# Patient Record
Sex: Male | Born: 1935 | Race: Black or African American | Hispanic: No | Marital: Married | Smoking: Former smoker
Health system: Southern US, Community
[De-identification: ages and names within clinical notes are randomized; demographics above are authoritative.]

## PROBLEM LIST (undated history)

## (undated) DIAGNOSIS — I2510 Atherosclerotic heart disease of native coronary artery without angina pectoris: Secondary | ICD-10-CM

## (undated) DIAGNOSIS — E785 Hyperlipidemia, unspecified: Secondary | ICD-10-CM

## (undated) DIAGNOSIS — N529 Male erectile dysfunction, unspecified: Secondary | ICD-10-CM

## (undated) DIAGNOSIS — N179 Acute kidney failure, unspecified: Secondary | ICD-10-CM

## (undated) DIAGNOSIS — E119 Type 2 diabetes mellitus without complications: Secondary | ICD-10-CM

## (undated) DIAGNOSIS — I10 Essential (primary) hypertension: Secondary | ICD-10-CM

## (undated) HISTORY — DX: Acute kidney failure, unspecified: N17.9

## (undated) HISTORY — DX: Hyperlipidemia, unspecified: E78.5

## (undated) HISTORY — DX: Type 2 diabetes mellitus without complications: E11.9

## (undated) HISTORY — DX: Male erectile dysfunction, unspecified: N52.9

## (undated) HISTORY — DX: Atherosclerotic heart disease of native coronary artery without angina pectoris: I25.10

## (undated) HISTORY — DX: Essential (primary) hypertension: I10

---

## 1980-12-10 HISTORY — PX: LUMBAR LAMINECTOMY: SHX95

## 2003-04-07 ENCOUNTER — Encounter: Payer: Self-pay | Admitting: Family Medicine

## 2003-04-07 ENCOUNTER — Ambulatory Visit (HOSPITAL_COMMUNITY): Admission: RE | Admit: 2003-04-07 | Discharge: 2003-04-07 | Payer: Self-pay | Admitting: Family Medicine

## 2003-12-09 ENCOUNTER — Ambulatory Visit (HOSPITAL_COMMUNITY): Admission: RE | Admit: 2003-12-09 | Discharge: 2003-12-09 | Payer: Self-pay | Admitting: General Surgery

## 2003-12-11 HISTORY — PX: ROTATOR CUFF REPAIR: SHX139

## 2004-07-10 ENCOUNTER — Encounter (HOSPITAL_COMMUNITY): Admission: RE | Admit: 2004-07-10 | Discharge: 2004-08-09 | Payer: Self-pay | Admitting: Orthopedic Surgery

## 2004-10-03 ENCOUNTER — Ambulatory Visit (HOSPITAL_COMMUNITY): Admission: RE | Admit: 2004-10-03 | Discharge: 2004-10-03 | Payer: Self-pay | Admitting: Orthopedic Surgery

## 2004-10-05 ENCOUNTER — Encounter (HOSPITAL_COMMUNITY): Admission: RE | Admit: 2004-10-05 | Discharge: 2004-11-04 | Payer: Self-pay | Admitting: Orthopedic Surgery

## 2004-10-12 ENCOUNTER — Ambulatory Visit: Payer: Self-pay | Admitting: Orthopedic Surgery

## 2004-11-06 ENCOUNTER — Encounter (HOSPITAL_COMMUNITY): Admission: RE | Admit: 2004-11-06 | Discharge: 2004-12-06 | Payer: Self-pay | Admitting: Orthopedic Surgery

## 2004-11-15 ENCOUNTER — Ambulatory Visit: Payer: Self-pay | Admitting: Orthopedic Surgery

## 2004-12-07 ENCOUNTER — Ambulatory Visit: Payer: Self-pay | Admitting: Orthopedic Surgery

## 2004-12-08 ENCOUNTER — Encounter (HOSPITAL_COMMUNITY): Admission: RE | Admit: 2004-12-08 | Discharge: 2005-01-07 | Payer: Self-pay | Admitting: Orthopedic Surgery

## 2004-12-27 ENCOUNTER — Ambulatory Visit: Payer: Self-pay | Admitting: Orthopedic Surgery

## 2005-01-08 ENCOUNTER — Encounter (HOSPITAL_COMMUNITY): Admission: RE | Admit: 2005-01-08 | Discharge: 2005-02-07 | Payer: Self-pay | Admitting: Orthopedic Surgery

## 2005-01-29 ENCOUNTER — Ambulatory Visit: Payer: Self-pay | Admitting: Orthopedic Surgery

## 2005-02-26 ENCOUNTER — Ambulatory Visit: Payer: Self-pay | Admitting: Cardiology

## 2005-02-26 ENCOUNTER — Inpatient Hospital Stay (HOSPITAL_COMMUNITY): Admission: AD | Admit: 2005-02-26 | Discharge: 2005-02-28 | Payer: Self-pay | Admitting: Emergency Medicine

## 2005-02-27 ENCOUNTER — Ambulatory Visit: Payer: Self-pay | Admitting: Cardiology

## 2005-03-26 ENCOUNTER — Ambulatory Visit: Payer: Self-pay | Admitting: Cardiology

## 2005-10-12 ENCOUNTER — Ambulatory Visit: Payer: Self-pay | Admitting: Cardiology

## 2006-04-03 ENCOUNTER — Ambulatory Visit: Payer: Self-pay | Admitting: Orthopedic Surgery

## 2006-10-15 ENCOUNTER — Ambulatory Visit: Payer: Self-pay | Admitting: Cardiology

## 2006-12-10 HISTORY — PX: COLONOSCOPY: SHX174

## 2007-06-26 ENCOUNTER — Ambulatory Visit: Payer: Self-pay | Admitting: Gastroenterology

## 2007-06-26 ENCOUNTER — Ambulatory Visit (HOSPITAL_COMMUNITY): Admission: RE | Admit: 2007-06-26 | Discharge: 2007-06-26 | Payer: Self-pay | Admitting: Gastroenterology

## 2007-10-23 ENCOUNTER — Ambulatory Visit: Payer: Self-pay | Admitting: Cardiology

## 2008-09-07 ENCOUNTER — Ambulatory Visit: Payer: Self-pay | Admitting: Cardiology

## 2008-11-02 ENCOUNTER — Encounter (INDEPENDENT_AMBULATORY_CARE_PROVIDER_SITE_OTHER): Payer: Self-pay | Admitting: *Deleted

## 2008-11-02 LAB — CONVERTED CEMR LAB
ALT: 25 units/L
Albumin: 4.9 g/dL
CO2: 19 meq/L
Chloride: 104 meq/L
Sodium: 140 meq/L
Total Protein: 7.2 g/dL

## 2009-10-19 DIAGNOSIS — N529 Male erectile dysfunction, unspecified: Secondary | ICD-10-CM | POA: Insufficient documentation

## 2009-10-20 ENCOUNTER — Encounter (INDEPENDENT_AMBULATORY_CARE_PROVIDER_SITE_OTHER): Payer: Self-pay | Admitting: *Deleted

## 2009-10-20 ENCOUNTER — Ambulatory Visit: Payer: Self-pay | Admitting: Cardiology

## 2009-10-20 DIAGNOSIS — I2510 Atherosclerotic heart disease of native coronary artery without angina pectoris: Secondary | ICD-10-CM | POA: Insufficient documentation

## 2009-10-24 ENCOUNTER — Encounter: Payer: Self-pay | Admitting: *Deleted

## 2009-10-24 ENCOUNTER — Encounter: Payer: Self-pay | Admitting: Cardiology

## 2009-10-24 LAB — CONVERTED CEMR LAB
ALT: 19 units/L
AST: 26 units/L (ref 0–37)
Albumin: 4.7 g/dL (ref 3.5–5.2)
Alkaline Phosphatase: 38 units/L — ABNORMAL LOW (ref 39–117)
BUN: 30 mg/dL
CK-MB: 313 ng/mL
Chloride: 107 meq/L
Creatinine, Ser: 1.18 mg/dL
HDL: 46 mg/dL
HDL: 46 mg/dL (ref >39)
LDL Cholesterol: 82 mg/dL (ref 0–99)
Potassium: 4.3 meq/L (ref 3.5–5.3)
Sodium: 141 meq/L (ref 135–145)
Total CK: 313 units/L — ABNORMAL HIGH (ref 7–232)
Total Protein: 7.4 g/dL (ref 6.0–8.3)
Triglycerides: 93 mg/dL

## 2009-11-08 ENCOUNTER — Encounter: Payer: Self-pay | Admitting: Cardiology

## 2010-11-17 ENCOUNTER — Encounter (INDEPENDENT_AMBULATORY_CARE_PROVIDER_SITE_OTHER): Payer: Self-pay | Admitting: *Deleted

## 2011-01-09 NOTE — Letter (Signed)
Summary: Appointment - Reminder 2  [facility] at [facility] [address]   [place], [place] [address]   Phone: [phone]  Fax: [phone]     November 17, 2010 MRN: [id number]   Daniel Reynolds [address] [place], [place]  [address]   Dear [name],  Our records indicate that it is time to schedule a follow-up appointment.  Dr.  Dietrich Pates        recommended that you follow up with [place] in 11.2011 PAST DUE           . It is very important that we reach you to schedule this appointment. We look forward to participating in your health care needs. Please contact us at the number listed above at your earliest convenience to schedule your appointment.  If you are unable to make an appointment at this time, give [place] a call so we can update our records.     Sincerely,   Glass blower/designer

## 2011-01-09 NOTE — Assessment & Plan Note (Signed)
Summary: 16 yr fu/sn  Medications Added LISINOPRIL-HYDROCHLOROTHIAZIDE 20-25 MG TABS (LISINOPRIL-HYDROCHLOROTHIAZIDE) Take 1 tablet by mouth once a day LISINOPRIL-HYDROCHLOROTHIAZIDE 20-25 MG TABS (LISINOPRIL-HYDROCHLOROTHIAZIDE) Take 1 tablet by mouth once a day ZETIA 10 MG TABS (EZETIMIBE) take 1 tab daily METOPROLOL TARTRATE 25 MG TABS (METOPROLOL TARTRATE) take 1 tab three times a day GLYBURIDE-METFORMIN 1.25-250 MG TABS (GLYBURIDE-METFORMIN) take 1 tab two times a day ACTOPLUS MET 15-850 MG TABS (PIOGLITAZONE HCL-METFORMIN HCL) take 1 tab daily ADVIL 200 MG TABS (IBUPROFEN) take as directed ASPIRIN 81 MG TBEC (ASPIRIN) Take one tablet by mouth daily      Allergies Added: NKDA  Visit Type:  Follow-up Primary Provider:  Dr. Mirna Mires   History of Present Illness: Return visit for this very nice 77 year old gentleman, who has done very well with medical therapy following a non-Q myocardial infarction in 2006.  He walks 3 miles per day with no chest discomfort nor dyspnea.  He has noted no edema.  He is tolerating his revised medical regimen without any significant symptoms, but he is experiencing some financial distress.  He has had no serious medical illnesses nor required urgent medical care during the past year.  Current Medications (verified): 1)  Pravastatin Sodium 40 Mg Tabs (Pravastatin Sodium) .... Take One Tablet By Mouth Daily At Bedtime 2)  Lisinopril-Hydrochlorothiazide 20-25 Mg Tabs (Lisinopril-Hydrochlorothiazide) .... Take 1 Tablet By Mouth Once A Day 3)  Zetia 10 Mg Tabs (Ezetimibe) .... Take 1 Tab Daily 4)  Metoprolol Tartrate 25 Mg Tabs (Metoprolol Tartrate) .... Take 1 Tab Three Times A Day 5)  Glyburide-Metformin 1.25-250 Mg Tabs (Glyburide-Metformin) .... Take 1 Tab Two Times A Day 6)  Actoplus Met 15-850 Mg Tabs (Pioglitazone Hcl-Metformin Hcl) .... Take 1 Tab Daily 7)  Advil 200 Mg Tabs (Ibuprofen) .... Take As Directed 8)  Aspirin 81 Mg Tbec (Aspirin) ....  Take One Tablet By Mouth Daily  Allergies (verified): No Known Drug Allergies  Past History:  PMH, FH, and Social History reviewed and updated.  Review of Systems  The patient denies weight loss, weight gain, hoarseness, chest pain, syncope, dyspnea on exertion, peripheral edema, prolonged cough, headaches, and abdominal pain.    Vital Signs:  Patient profile:   77 year old male Height:      70 inches Weight:      209 pounds BMI:     30.10 Pulse rate:   52 / minute BP sitting:   122 / 71  (right arm)  Vitals Entered By: Dreama Saa, CNA (October 20, 2009 3:16 PM)  Physical Exam  General:   General-Well developed; no acute distress; Bilateral arcus; pinpoint pupils   Neck-No JVD; no carotid bruits: Lungs-No tachypnea, no rales; no rhonchi; no wheezes: Cardiovascular-normal PMI;distant S1 and S2: Abdomen-BS normal; soft and non-tender without masses or organomegaly:  Musculoskeletal-No deformities, no cyanosis or clubbing: Neurologic-Normal cranial nerves; symmetric strength and tone:  Skin-Warm, no significant lesions: Extremities-Nl distal pulses; no edema:     Impression & Recommendations:  Problem # 1:  ATHEROSCLEROTIC CARDIOVASCULAR DISEASE (ICD-429.2) No symptoms to suggest recurrent myocardial ischemia.  Management will concentrate on optimization of risk factor treatment.  Problem # 2:  DYSLIPIDEMIA (ICD-272.4) Lipid profile was excellent  one year ago.  A repeat lipid profile plus chemistry profile will be obtained.  Problem # 3:  HYPERTENSION (ICD-401.9) ure control has been excellent.  To reduce the cost of his medical regime, Benicar will be changed to lisinopril.  Problem # 4:  DIABETES MELLITUS, TYPE II (  ICD-250.00) Control of the diabetes has reportedly been good according to the patient.  To further reduce the cost of his medications, you could consider discontinuing Actos/metformin and increasing his dose of the glyburide/metformin as  needed.  Assuming laboratory values are acceptable, I will plan to see this gentleman again in one year.  Other Orders: Future Orders: T-CK Total [phone]) ... 10/24/2009 T-Lipid Profile [id number]) ... 10/24/2009 T-Comprehensive Metabolic Panel [id number]) ... 10/24/2009  Patient Instructions: 1)  Your physician recommends that you schedule a follow-up appointment in: 1 year 2)  Your physician recommends that you return for lab work VH:QION week 3)  Your physician has recommended you make the following change in your medication: change benicar/hct to lisinopril hct 20/25 once daily Prescriptions: LISINOPRIL-HYDROCHLOROTHIAZIDE 20-25 MG TABS (LISINOPRIL-HYDROCHLOROTHIAZIDE) Take 1 tablet by mouth once a day  #30 x 6   Entered by:   Larita Fife Via LPN   Authorized by:   Kathlen Brunswick, MD, [facility]   Signed by:   Larita Fife Via LPN on 62/95/2841   Method used:   Electronically to        [facility] [phone]* (retail)       [address]       [place], [place]  [address]       Ph: [phone] or [phone]       Fax: [phone]   RxID:   [id number] LISINOPRIL-HYDROCHLOROTHIAZIDE 20-25 MG TABS (LISINOPRIL-HYDROCHLOROTHIAZIDE) Take 1 tablet by mouth once a day  #30 x 6   Entered by:   Teressa Lower RN   Authorized by:   Kathlen Brunswick, MD, [facility]   Signed by:   Larita Fife Via LPN on 23/55/7322   Method used:   Electronically to        [facility] [phone]* (retail)       [address]       [place], [place]  [address]       Ph: [phone] or [phone]       Fax: [phone]   RxID:   [id number]

## 2011-02-21 ENCOUNTER — Encounter: Payer: Self-pay | Admitting: *Deleted

## 2011-02-22 ENCOUNTER — Ambulatory Visit (INDEPENDENT_AMBULATORY_CARE_PROVIDER_SITE_OTHER): Payer: MEDICARE | Admitting: Cardiology

## 2011-02-22 ENCOUNTER — Encounter: Payer: Self-pay | Admitting: Cardiology

## 2011-02-22 DIAGNOSIS — I2510 Atherosclerotic heart disease of native coronary artery without angina pectoris: Secondary | ICD-10-CM

## 2011-02-23 ENCOUNTER — Encounter: Payer: Self-pay | Admitting: Cardiology

## 2011-02-27 NOTE — Miscellaneous (Signed)
Summary: labs cmp,lipid,ck 10/24/2009  Clinical Lists Changes  Observations: Added new observation of CK-MB ISOENZ: 313 ng/mL (10/24/2009 16:26) Added new observation of CALCIUM: 9.3 mg/dL (04/54/0981 19:14) Added new observation of ALBUMIN: 4.7 g/dL (78/29/5621 30:86) Added new observation of PROTEIN, TOT: 7.4 g/dL (57/84/6962 95:28) Added new observation of SGPT (ALT): 19 units/L (10/24/2009 16:26) Added new observation of SGOT (AST): 26 units/L (10/24/2009 16:26) Added new observation of ALK PHOS: 38 units/L (10/24/2009 16:26) Added new observation of CREATININE: 1.18 mg/dL (41/32/4401 02:72) Added new observation of BUN: 30 mg/dL (53/66/4403 47:42) Added new observation of BG RANDOM: 109 mg/dL (59/56/3875 64:33) Added new observation of CO2 PLSM/SER: 19 meq/L (10/24/2009 16:26) Added new observation of CL SERUM: 107 meq/L (10/24/2009 16:26) Added new observation of K SERUM: 4.3 meq/L (10/24/2009 16:26) Added new observation of NA: 141 meq/L (10/24/2009 16:26) Added new observation of LDL: 82 mg/dL (29/51/8841 66:06) Added new observation of HDL: 46 mg/dL (30/16/0109 32:35) Added new observation of TRIGLYC TOT: 93 mg/dL (57/32/2025 42:70) Added new observation of CHOLESTEROL: 147 mg/dL (62/37/6283 15:17)

## 2011-02-27 NOTE — Letter (Signed)
Summary: hand written lab orders  hand written lab orders   Imported By: Faythe Ghee 02/23/2011 09:29:22  _____________________________________________________________________  External Attachment:    Type:   Image     Comment:   External Document

## 2011-03-08 NOTE — Assessment & Plan Note (Signed)
Summary: past due for f/u per pt/tg  Medications Added PRAVACHOL 20 MG TABS (PRAVASTATIN SODIUM) take 1 tablet by mouth once daily METOPROLOL TARTRATE 25 MG TABS (METOPROLOL TARTRATE) take 1/2  tab two times a day METOPROLOL TARTRATE 25 MG TABS (METOPROLOL TARTRATE) take 1 tab two times a day IBUPROFEN 800 MG TABS (IBUPROFEN) take as needed ZETIA 10 MG TABS (EZETIMIBE) take 1 tab daily      Allergies Added: NKDA  Visit Type:  Follow-up Primary Provider:  Dr. Mirna Mires   History of Present Illness: [name] returns to the office for continued assessment and treatment of coronary disease.  He continues to do extremely well with no chest discomfort and no dyspnea despite daily walks of up to 3 miles.  He has developed no new medical issues.  He has experienced myalgias prompting him to decrease his dose of pravastatin to 40 mg q.o.d.  Current Medications (verified): 1)  Pravachol 20 Mg Tabs (Pravastatin Sodium) .... Take 1 Tablet By Mouth Once Daily 2)  Benicar Hct 40-25 Mg Tabs (Olmesartan Medoxomil-Hctz) .... Take 1 Tablet By Mouth Once A Day 3)  Metoprolol Tartrate 25 Mg Tabs (Metoprolol Tartrate) .... Take 1/2  Tab Two Times A Day 4)  Glyburide-Metformin 1.25-250 Mg Tabs (Glyburide-Metformin) .... Take 1 Tab Two Times A Day 5)  Actoplus Met 15-850 Mg Tabs (Pioglitazone Hcl-Metformin Hcl) .... Take 1 Tab Daily 6)  Ibuprofen 800 Mg Tabs (Ibuprofen) .... Take As Needed 7)  Aspirin 81 Mg Tbec (Aspirin) .... Take One Tablet By Mouth Daily 8)  Zetia 10 Mg Tabs (Ezetimibe) .... Take 1 Tab Daily  Allergies (verified): No Known Drug Allergies  Comments:  Nurse/Medical Assistant: patient brought med bottles he uses [facility] in [place]  Past History:  PMH, FH, and Social History reviewed and updated.  Review of Systems       See history of present illness.  Vital Signs:  Patient profile:   77 year old male Weight:      207 pounds BMI:     29.81 Pulse rate:   51 /  minute BP sitting:   123 / 74  (left arm)  Vitals Entered By: Dreama Saa, CNA (February 22, 2011 2:39 PM)  Physical Exam  General:  Mildly overweight; well developed; no acute distress Neck-No JVD; no carotid bruits: Lungs-clear to auscultation; resonant to percussion Cardiovascular-normal PMI;distant S1 and S2; S4 present Abdomen-BS normal; soft and non-tender without masses or organomegaly:  Musculoskeletal-No deformities, no cyanosis or clubbing: Neurologic-Normal cranial nerves; symmetric strength and tone:  Skin-Warm, no significant lesions: Extremities-Nl distal pulses; no edema:     EKG  Procedure date:  02/22/2011  Findings:      Sinus bradycardia with first degree AV block Right ventricular conduction delay J-Point elevation No previous tracing for comparison.   Impression & Recommendations:  Problem # 1:  ATHEROSCLEROTIC CARDIOVASCULAR DISEASE (ICD-429.2) Patient remains very stable with no clinical signs nor symptoms of myocardial ischemia or dysfunction.  Focus will remain on optimal control of vascular risk factors.  Heart rate is quite slow, albeit without symptoms, and PR interval is fairly long.  Dose of metoprolol will be further reduced to 12.5 mg b.i.d.  Problem # 2:  DYSLIPIDEMIA (ICD-272.4) Most recent lipid profile was good, but is more than one year old and was obtained on therapy different than he is currently taking.  I advised him to change administration of pravastatin to 20 mg q.d.  A lipid profile will be repeated  in one month.  CHOL: 147 (10/24/2009)   LDL: 82 (10/24/2009)   HDL: 46 (10/24/2009)   TG: 93 (10/24/2009)  Problem # 3:  HYPERTENSION (ICD-401.9) Blood pressure control is excellent; current medications will be continued.  BP today: 123/74 Prior BP: 122/71 (10/20/2009)  Labs Reviewed: K+: 4.3 (10/24/2009) Creat: : 1.18 (10/24/2009)    Patient Instructions: 1)  Your physician recommends that you schedule a follow-up  appointment in: 1 year 2)  Your physician has recommended you make the following change in your medication: decrease Pravachol to 20mg  by mouth once daily and decrease Metoprolol to 1/2 tablet (12.5mg ) by mouth two times a day  Prescriptions: METOPROLOL TARTRATE 25 MG TABS (METOPROLOL TARTRATE) take 1/2  tab two times a day  #30 x 11   Entered by:   Larita Fife Via LPN   Authorized by:   Kathlen Brunswick, MD, [facility]   Signed by:   Larita Fife Via LPN on 16/09/9603   Method used:   Electronically to        [facility] [phone]* (retail)       [address]       [place], [place]  [address]       Ph: [phone] or [phone]       Fax: [phone]   RxID:   [id number] PRAVACHOL 20 MG TABS (PRAVASTATIN SODIUM) take 1 tablet by mouth once daily  #30 x 11   Entered by:   Larita Fife Via LPN   Authorized by:   Kathlen Brunswick, MD, [facility]   Signed by:   Larita Fife Via LPN on 74/25/9563   Method used:   Electronically to        [facility] [phone]* (retail)       [address]       [place], [place]  [address]       Ph: [phone] or [phone]       Fax: [phone]   RxID:   [id number]

## 2011-03-09 ENCOUNTER — Telehealth: Payer: Self-pay | Admitting: Cardiology

## 2011-03-09 NOTE — Telephone Encounter (Signed)
PT is at Dr [facility] office stating that Daniel Reynolds wanting there office to draw extra labs on him. I do not see any orders in system please call office back with orders.

## 2011-03-09 NOTE — Telephone Encounter (Signed)
Made copy of request and faxed to Dr. Adaline Sill office

## 2011-04-24 NOTE — Letter (Signed)
September 07, 2008    Annia Friendly. Loleta Chance, MD  [address]  [place], [place]  [address]   RE:  SUNG, PARODI  MRN:  [id number]  /  DOB:  [birth year]   Dear Earvin Hansen,   Mr. Breighner returns to the office for continued assessment and treatment  of coronary disease and cardiovascular risk factors, now 3-1/2 years  following a non-Q myocardial infarction due to single-vessel coronary  disease.  He noted myalgias and sought evaluation in your office.  CPK  was twice the upper limit of normal.  Atorvastatin has been discontinued  with resolution of his symptoms.  Otherwise, he is doing quite well.  He  continues to keep up with his wife, who is 20 years his junior.  He  notes no dyspnea nor chest discomfort.  Diabetic control has been  excellent with a hemoglobin A1c level of 5.7.   CURRENT MEDICATIONS:  1. Aspirin 81 mg daily.  2. Benicar/HCT 20/12.5 mg daily.  3. Glucovance 1.25/250 daily.  4. Metoprolol 25 mg b.i.d.  5. Nexium 40 mg daily.  6. ACTOplus 15/150 mg daily.   PHYSICAL EXAMINATION:  GENERAL:  Pleasant proportionate gentleman.  VITAL SIGNS:  The weight is 208, 4 pounds less than last year.  Blood  pressure 155/85, heart rate 55 and regular, respirations 14.  NECK:  No jugular venous distention; normal carotid upstrokes without  bruits.  LUNGS:  Clear.  CARDIAC:  Normal first and second heart sounds; modest basilar systolic  ejection murmur and fourth heart sound present.  ABDOMEN:  Soft and nontender; no organomegaly.  EXTREMITIES:  No edema.   IMPRESSION:  Mr. Cordell has had a clear adverse effect to atorvastatin.  Since the statins are so valuable and since he tolerated pravastatin in  the past, we will resume that medication at a dose of 40 mg daily.  Ezetimibe 10 mg daily will be added.  A lipid profile, chemistry  profile, and CPK level will be obtained in 1 month.   Control of hypertension is suboptimal.  His dose of Benicar/HCT will be  doubled.  If this  is not adequate, please feel free to adjust his  medications accordingly.  He cannot take more beta-blocker due to  bradycardia.  The next drug would probably be amlodipine.  I will plan  to see this nice gentleman again in 1 year.    Sincerely,      Gerrit Friends. Dietrich Pates, MD, [facility]  Electronically Signed    RMR/MedQ  DD: 09/07/2008  DT: 09/08/2008  Job #: [id number]

## 2011-04-24 NOTE — Letter (Signed)
October 23, 2007    Annia Friendly. Loleta Chance, MD  [address]  [place], [place]  [address]   RE:  Daniel, Reynolds  MRN:  [id number]  /  DOB:  03-18-1932   Dear Daniel Reynolds:   Daniel Reynolds returns to the office for continued assessment and treatment  of coronary disease and cardiovascular risk factors.  Since the last  visit, he has done extremely well.  He reports no acute medical issues.  Diabetic control has been excellent at home with CBGs typically below  120.  His blood pressure control has been good.  The most recent lipid  profile I have is from March and indicates suboptimal control of  cholesterol with total cholesterol of 193, HDL 49 and LDL of 124.  He  has been using Cialis, prescribed to him at his list visit with benefit.   CURRENT MEDICATIONS:  1. Aspirin 81 mg daily.  2. Benicar 20/12.5 mg daily.  3. Glucovance 1.25/250 daily.  4. Metoprolol 25 mg b.i.d.  5. Nexium 40 mg daily.  6. ACTOplus 15/850 mg daily.  7. Pravastatin 80 mg daily.   Daniel Reynolds is proud that he recently was married to a woman  approximately 20 years his junior.  He also told me of the unfortunately  death of a former girlfriend who died suddenly on a bus.  He was  interested in my opinion as to possible causes of death.   EXAMINATION:  Very pleasant gentleman in no acute distress.  The weight  is 212, three pounds more than last year, blood pressure 125/70, heart  rate 62 and regular, respirations 18.  NECK:  No jugular venous distention; normal carotid upstrokes without  bruits.  LUNGS:  Clear.  CARDIAC:  Normal first and second heart sounds; modest systolic ejection  murmur.  ABDOMEN:  Soft and nontender; no organomegaly; no bruits.  EXTREMITIES:  Excellent distal pulses; no edema.   IMPRESSION:  Daniel Reynolds is doing extremely well in general.  We will  change pravastatin to atorvastatin 40 mg daily for better control of  hyperlipidemia.  A lipid profile and chemistry profile will be  obtained  in 2 months.  Flu vaccine was administered at the patient's request.  I  will see this nice gentleman again in 1 year.    Sincerely,      Gerrit Friends. Dietrich Pates, MD, [facility]  Electronically Signed    RMR/MedQ  DD: 10/23/2007  DT: 10/24/2007  Job #: [id number]

## 2011-04-24 NOTE — Op Note (Signed)
NAMEKENSTON, LONGTON                ACCOUNT NO.:  [id number]   MEDICAL RECORD NO.:  [id number]          PATIENT TYPE:  AMB   LOCATION:  DAY                           FACILITY:  [facility]   PHYSICIAN:  Kassie Mends, M.D.      DATE OF BIRTH:  1932-07-10   DATE OF PROCEDURE:  06/26/2007  DATE OF DISCHARGE:                               OPERATIVE REPORT   REFERRING PHYSICIAN:  Annia Friendly. Hill, MD   PROCEDURE:  Colonoscopy.   INDICATION FOR EXAM:  Mr. Daywalt is a 77 year old male who presents for  average risk colon cancer screening.  His last colonoscopy was five  years ago.   FINDINGS:  1. Moderate amount of liquid and particulate stool seen in left colon,      obscuring the view.  Polyps less than 1 cm would been easily      missed.  Otherwise able to visualize adequately the distal      transverse colon to the cecum.  No polyps, masses, inflammatory      changes or arteriovenous malformations.  2. Normal retroflexed view of the rectum.  3. Sigmoid colon diverticulosis.   RECOMMENDATIONS:  1. Recommend a flexible sigmoidoscopy within the next three to six      months with a Half-Lytely bowel prep.  2. He should follow a fiber diet.  He is given a handout on high-fiber      diet and diverticulosis.   MEDICATIONS:  1. Demerol 100 mg IV.  2. Versed 5 mg IV.   PROCEDURE TECHNIQUE:  Physical exam was performed.  Informed consent was  obtained from the patient after explaining the benefits, risks and  alternatives to the procedure.  The patient was connected to monitor and  placed in left lateral position.  Continuous oxygen was provided by  nasal cannula IV medicine administered through an indwelling cannula.  After administration of sedation and rectal exam, the patient's rectum  was intubated and scope  was advanced under direct visualization to the cecum.  The scope was  removed slowly by carefully examine the color, texture, anatomy and  integrity of mucosa on the way out.  The  patient was recovered in  endoscopy and discharged home in satisfactory condition.      Kassie Mends, M.D.  Electronically Signed     SM/MEDQ  D:  06/26/2007  T:  06/27/2007  Job:  [id number]   cc:   Annia Friendly. Loleta Chance, MD  Fax: [phone]

## 2011-04-27 NOTE — Letter (Signed)
October 15, 2006    Annia Friendly. Loleta Chance, MD  [address]  [place], [place]  [address]   RE:  Daniel Reynolds, Daniel Reynolds  MRN:  [id number]  /  DOB:  December 04, 1932   Dear Daniel Reynolds:   Daniel Reynolds returns to the office for continued assessment and treatment of  coronary disease.  Since a small non-Q MI in March 2006 at which time he had  moderate two-vessel disease amenable to medical therapy, he has done well.  He reports no chest discomfort or dyspnea.  He does experience fatigue with  metoprolol, noticing sleepiness after each dose.  He has not been taking  Pravastatin as ordered, preferring to use a half dose to protect his liver.  His last lipid profile was suboptimal.   He continues to have erectile dysfunction but notes visual disturbance with  Viagra and minimal benefit.   EXAMINATION:  GENERAL:  Pleasant gentleman in no acute distress.  VITAL SIGNS:  The weight is 209, 3 pounds less than last year.  Blood  pressure 135/75, heart rate 64 and regular, respirations 16.  NECK:  No jugular venous distention; normal carotid upstrokes without  bruits.  LUNGS:  Clear.  CARDIAC:  Normal first and second heart sounds; fourth heart sound present.  ABDOMEN:  Soft and nontender; no bruits.  EXTREMITIES:  No edema.   IMPRESSION:  Daniel Reynolds is doing well from a cardiac standpoint.  I have  encouraged him to use Pravastatin as ordered.  We will recheck a lipid  profile in 1 month.  He will decrease his dose of metoprolol to 25 mg b.i.d.  He will stop clopidogrel since long-term value is minimal, if any.  I have  given him a prescription for Cialis and will plan to reassess this nice  gentleman in 1 year.  Influenza vaccine was administered at his request.    Sincerely,      Gerrit Friends. Dietrich Pates, MD, [facility]  Electronically Signed    RMR/MedQ  DD: 10/15/2006  DT: 10/15/2006  Job #: [id number]

## 2011-04-27 NOTE — H&P (Signed)
Daniel Reynolds, Daniel Reynolds                ACCOUNT NO.:  [id number]   MEDICAL RECORD NO.:  [id number]          PATIENT TYPE:  INP   LOCATION:  A225                          FACILITY:  [facility]   PHYSICIAN:  Jackie Plum, M.D.DATE OF BIRTH:  09-19-1932   DATE OF ADMISSION:  02/26/2005  DATE OF DISCHARGE:  LH                                HISTORY & PHYSICAL   CHIEF COMPLAINT:  Chest pain.   HISTORY OF PRESENT ILLNESS:  The patient is a 77 year old African-American  gentleman with a history of hypertension, diabetes and dyslipidemia, who  presented with the above complaint.  The patient has been walking about  three miles, six days a week for years and has not had any problems.  Today  when he went on his usual walk after about 1-1/2 miles, he stated he  experienced some substernal chest discomfort, which is described more of a  pressure-like nature, nonradiating.  This lasted a few minutes, was  resolving after resting.  There was no associated nausea, vomiting or  diaphoresis, but the patient felt short of breath at times.  It was  described as mild in intensity and by the time the patient came to the ED,  the symptoms had resolved.  He denied any history of fever, chills, cough,  sputum production, ankle swelling or calf or leg pain, any dizziness,  dysuria or frequency of micturition.  In the emergency room, the patient had  a 12-lead EKG which showed normal sinus rhythm without any acute ST wave  changes.  He had cardiac enzymes, which indicated mildly elevated troponin  of 0.12 with CK-MB of 5.2 and CK of 373 and normal relative index of 4.4.  In view of the patient's significant cardiac risk factors, he was  recommended for admission to rule out a myocardial infarction/ischemia.   PAST MEDICAL HISTORY:  Please see above.  The patient denies any previous  history of heart attacks or any previous heart problems.   FAMILY HISTORY:  Negative for strokes.  His father died at the age of  14.  His mother died at the age of 32 from cardiac causes.  He indicated that the  mom had a pacemaker placed at the time she died.   MEDICATIONS:  The patient alleges that he is allergic to PREDNISONE.   He takes Glucovance, Surveyor, mining, Equities trader, amongst others, but is unable to  remember the exact dosages of these medicines.  However, review of patient's  H&P on E-chart dated November 18, 2003, indicated that he was on Glucovance  1.25/250 mg once daily, Pravachol 80 mg daily, aspirin 81 mg daily, Sular 20  mg daily, Benicar/HCTZ 20/12.5 mg daily.   SOCIAL HISTORY:  The patient is a retired Quarry manager.  Never smoked  cigarettes nor drank alcohol.   REVIEW OF SYSTEMS:  As noted under HPI, otherwise unremarkable.   PHYSICAL EXAMINATION:  VITAL SIGNS:  BP was 137/76, pulse rate of 72,  respirations 18, temperature 97.4 degrees Fahrenheit.  GENERAL:  He was not in acute cardiopulmonary distress, was not in any pain  or distress.  HEENT:  Normocephalic, atraumatic.  Pupils equal, round, and reactive to  light, extraocular movements intact.  Oropharynx moist.  NECK:  Supple with no JVD and no carotid bruit.  CHEST:  Lungs clear to auscultation.  ABDOMEN:  Obese, soft, nontender, bowel sounds present.  EXTREMITIES:  No cyanosis.  He had trace bipedal edema.  CENTRAL NERVOUS SYSTEM:  Exam is nonfocal.   LABORATORY DATA:  Chest x-ray showed mild cardiac enlargement without any  acute infiltrate.  A 12-lead EKG as noted under HPI above.  WBC count was  5.2, hemoglobin 11.3, hematocrit 33.8, MCV 85.5, platelet count 194.  PTT  30, INR 0.9, PT 12.2.  Sodium 138, potassium 3.6, chloride 109, CO2 22,  glucose 155, BUN 24, creatinine 1.1, total bilirubin 0.4, alkaline  phosphatase 61, AST 32, ALT 51, total protein 7.3, albumin 4.3, calcium 9.6.   IMPRESSION:  Atypical chest pain in a patient with multiple risk factors for  coronary artery disease, which include male sex, age, diabetes,  hypertension  and dyslipidemia.   PLAN:  Admit the patient for rule out protocol and possibility of stress if  he rules out in the morning prior to discharge.      GO/MEDQ  D:  02/26/2005  T:  02/26/2005  Job:  [id number]   cc:   Annia Friendly. Loleta Chance, MD  [address]  [place]  [place] [address]  Fax: [phone]

## 2011-04-27 NOTE — Op Note (Signed)
NAMERAUN, ROUTH                ACCOUNT NO.:  [id number]   MEDICAL RECORD NO.:  [id number]          PATIENT TYPE:  AMB   LOCATION:  DAY                           FACILITY:  [facility]   PHYSICIAN:  Vickki Hearing, M.D.DATE OF BIRTH:  [birth year]   DATE OF PROCEDURE:  10/03/2004  DATE OF DISCHARGE:                                 OPERATIVE REPORT   PREOPERATIVE DIAGNOSIS:  Rotator cuff syndrome, right shoulder.   POSTOPERATIVE DIAGNOSIS:  Rotator cuff tear, supraspinatus tendon.   PROCEDURE:  Arthroscopic subacromial decompression, arthroscopy right  shoulder, debridement of degenerative slap tear, open rotator cuff repair.   SURGEON:  Vickki Hearing, M.D.   ANESTHESIA:  General.   OPERATIVE FINDINGS:  There was a Type 1 slap lesion of the biceps tendon.  There was a large amount of subacromial bursitis. There was a Type 2  acromial. There was a supraspinatus tendon tear with no retraction  approximately 2 cm long.   HISTORY:  A 77 year old male with chronic right shoulder pain treated  nonoperatively, failed nonoperative treatment, presented for right rotator  cuff repair.   DESCRIPTION OF PROCEDURE:  The patient was identified in the preoperative  holding as Nicola Police. Review of his medical record and consent confirmed  that he was to have a right shoulder arthroscopy for rotator cuff syndrome.  He marked the operative site, I signed my initial. He was given Ancef and  taken to the operating room for general anesthesia. He was placed in the  beach-chair position. After sterile prep and drape, we did a mandatory time  out and confirmed his identity, procedure, extremity, and that his  antibiotics were given and equipment necessary for the procedure was  available.   A posterior portal was established. Diagnostic arthroscopy was performed. An  anterior portal was established, and a debridement of his slap tear was  performed. All other structures in the shoulder  joint were normal.   Scope was placed in the subacromial space. The subacromial space was  extremely tight. There was a large amount of bursitis. This was debrided  using a lateral portal. We defined the borders of the acromion and AC joint  and did a subacromial decompression. At that point, the supraspinatus tendon  was found to be torn from the rotator cuff insertion.   We then did a mini-open procedure and made an incision over the anterior  corner of the acromion, divided the subcu tissues, split the deltoid without  detaching it from the acromion, grab the cuff with stay sutures, debrided  the tuberosity with a bur, placed 2 drill holes through the tuberosity and  lateral border of the humerus, and passed 2 sutures to repair the cuff. We  also placed one suture anchor with additional 2 sutures making a 4 suture  repair. This stabilized the cuff with good fixation and no tension. We  irrigated the wounds; closed the deltoid with #1 Vicryl, subcu tissue with 2-  0 Vicryl; injected 30 cc of Marcaine in the subacromial space; applied  staples, sterile dressings, CryoCuff, and sling; and took the patient  to  recovery room after extubation. He was in stable condition. Postoperative  plan was for him to follow up in 2 days. He can be discharged today on  [place] 10/650 and [place] 800.     Weyman Croon   SEH/MEDQ  D:  10/03/2004  T:  10/03/2004  Job:  [id number]

## 2011-04-27 NOTE — Procedures (Signed)
Daniel Reynolds, Daniel Reynolds                ACCOUNT NO.:  [id number]   MEDICAL RECORD NO.:  [id number]          PATIENT TYPE:  INP   LOCATION:                                FACILITY:  [facility]   PHYSICIAN:  [place] Bing, M.D.  DATE OF BIRTH:  10-16-1932   DATE OF PROCEDURE:  02/27/2005  DATE OF DISCHARGE:  02/27/2005                                  ECHOCARDIOGRAM   REFERRING PHYSICIAN:  Dr. Orvan Falconer and Dr. Dietrich Pates   CLINICAL DATA:  A 77 year old gentleman admitted with chest pain, systolic  murmur.   M-MODE:  Aorta 3.2.  Left atrium 5.2.  Septum 1.6.  Posterior wall 1.1.  LV  diastole 4.5.  LV systole 2.8.   1.  Technically adequate echocardiographic study.  2.  Mild to moderate left atrial enlargement; mild right atrial enlargement.  3.  Normal right ventricular size and function; no right ventricular      hypertrophy.  4.  Mild aortic valvular sclerosis.  5.  Delicate mitral valve; very mild regurgitation.  6.  Normal tricuspid valve; physiologic regurgitation.  7.  Normal proximal pulmonary artery and pulmonic valve.  8.  Normal left ventricular size; borderline hypertrophy except for the      proximal septum which is more prominently thickened. Normal regional and      global capital left ventricular systolic function.  9.  Normal capital IVC.      RR/MEDQ  D:  02/27/2005  T:  02/27/2005  Job:  [id number]

## 2011-04-27 NOTE — H&P (Signed)
Daniel Reynolds, Daniel Reynolds                            ACCOUNT NO.:  [id number]   MEDICAL RECORD NO.:  [id number]                   PATIENT TYPE:  AMB   LOCATION:  DAY                                  FACILITY:  [facility]   PHYSICIAN:  Jerolyn Shin C. Katrinka Blazing, M.D.                DATE OF BIRTH:  19-Oct-1932   DATE OF ADMISSION:  DATE OF DISCHARGE:                                HISTORY & PHYSICAL   HISTORY OF PRESENT ILLNESS:  This is a 77 year old male who is referred for  screening colonoscopy.  He has no GI symptoms.  There is no family history  of GI cancers.   PAST HISTORY:  History is positive for hypertension, diabetes mellitus.  Arthritis and hyperlipidemia.   PAST SURGICAL HISTORY:  Lumbar disk surgery in the remote past.   MEDICATIONS:  1. Glucovance 1.25/250 mg daily.  2. Indocin SR 75 mg daily.  3. Pravachol 80 mg daily.  4. Aspirin 81 mg daily.  5. Sular 20 mg daily.  6. Benicar HCT 20/12.5 mg daily.  7. Viagra p.r.n.   PHYSICAL EXAMINATION:  VITAL SIGNS:  On exam blood pressure 120/70, pulse 76  and respirations 18.  HEENT:  Unremarkable.  NECK:  Neck is supple without JVD or bruits.  CHEST:  Clear to auscultation.  HEART:  Regular rate and rhythm without murmur, gallop or rub.  ABDOMEN:  Abdomen is soft and nontender.  No masses.  EXTREMITIES:  Mild tenderness, left shoulder, with retained range of motion,  otherwise unremarkable.  NEUROLOGIC EXAMINATION:  No focal motor, sensory or cerebellar deficit.   IMPRESSION:  Need for screening colonoscopy.   PLAN:  Screen colonoscopy.     ___________________________________________                                         Dirk Dress. Katrinka Blazing, M.D.   LCS/MEDQ  D:  12/08/2003  T:  12/09/2003  Job:  [id number]

## 2011-04-27 NOTE — Cardiovascular Report (Signed)
Daniel Reynolds, Daniel Reynolds                ACCOUNT NO.:  [id number]   MEDICAL RECORD NO.:  [id number]          PATIENT TYPE:  INP   LOCATION:  4703                         FACILITY:  [facility]   PHYSICIAN:  Carole Binning, M.D. LHCDATE OF BIRTH:  1932/02/15   DATE OF PROCEDURE:  02/27/2005  DATE OF DISCHARGE:                              CARDIAC CATHETERIZATION   PROCEDURE PERFORMED:  Left heart catheterization with coronary angiography  and left ventriculography.   INDICATIONS:  The patient is a 76 year old male with diabetes. He presented  to [facility] with a 15-minute episode of substernal chest pain.  There were no acute EKG changes. Cardiac markers were borderline positive  for possible non-ST-segment elevation myocardial infarction. He was  therefore referred for cardiac catheterization.   PROCEDURE NOTE:  A 6-French sheath was placed in the right femoral artery.  Coronary angiography was performed with standard Judkins 6-French catheters.  Left ventriculography was performed with an angled pigtail catheter.  Contrast was Omnipaque. There are no complications.   RESULTS:  HEMODYNAMICS:  Left ventricular pressure 162/16, aortic pressure  140/88. There is no aortic valve gradient on catheter pullback.   Left ventriculogram:  Wall motion is normal, ejection fraction estimated  65%. There is no mitral regurgitation.   Coronary arteriography (codominant):  Left main has an ostial 30% stenosis.  Left anterior descending artery has diffuse 20% stenosis in the proximal and  mid vessel. LAD gives rise to a single normal sized diagonal branch which is  less than 2 mm in diameter but moderate length. There is a tubular 80%  stenosis. The proximal portion of this vessel extending to a bifurcation  point.  Left circumflex is a large codominant vessel. There is a 40% stenosis in the  ostium of circumflex. In the mid circumflex there is an irregular 60-70%  stenosis which extends into  the proximal portion of a normal-sized third  obtuse marginal branch. The continuation of the circumflex just beyond the  third obtuse marginal branch has a 50% stenosis. Circumflex gives rise to  small first obtuse marginal, large second obtuse marginal normal-sized third  obtuse marginal branch. The second obtuse marginal has a 50% stenosis at its  ostium. The third obtuse marginal has a 60 to 70% stenosis proximally, as  described above. The distal circumflex gives rise to three very small  posterolateral branches.  Right coronary artery is a relatively small codominant vessel. It has an  anterior origin. There are minor luminal irregularities. This vessel is  otherwise normal, giving rise to a small posterior descending artery.   IMPRESSION:  1.  Normal left ventricular systolic function.  2.  Moderate two-vessel coronary artery disease as described with disease in      a small caliber diagonal branch as      well as moderate disease in the mid left circumflex bifurcation point      extending into a third obtuse marginal branch.   RECOMMENDATIONS:  Medical therapy.      MWP/MEDQ  D:  02/27/2005  T:  02/27/2005  Job:  [id number]   cc:  Annia Friendly. Loleta Chance, MD  [address]  [place]  [place] [address]  Fax: [phone]   [place] Bing, M.D.

## 2011-04-27 NOTE — Consult Note (Signed)
NAMEANSEL, FERRALL                ACCOUNT NO.:  [id number]   MEDICAL RECORD NO.:  [id number]          PATIENT TYPE:  INP   LOCATION:  A225                          FACILITY:  [facility]   PHYSICIAN:  [place] Bing, M.D.  DATE OF BIRTH:  Jan 31, 1932   DATE OF CONSULTATION:  02/27/2005  DATE OF DISCHARGE:                                   CONSULTATION   REFERRING PHYSICIAN:  Jackie Plum, M.D.   PRIMARY CARE PHYSICIAN:  Dr. Loleta Chance.   HISTORY OF PRESENT ILLNESS:  A 77 year old gentleman admitted to hospital  with chest pain and abnormal cardiac markers.  Mr. Valdes has no prior  cardiac history.  He has never previously been evaluated by a cardiologist,  nor have any significant cardiac testing.  He does have multiple  cardiovascular risk factors including diabetes and hypertension.  He walks  regularly and noted the onset of a localized fullness in the mid and lower  substernal region while exercising on the morning of admission.  He returned  to his car and drove home, but the symptoms persisted for approximately 1/2  hour.  There was associated dyspnea, but no diaphoresis or nausea.  Due to  the fact that these sort of sensations were unusual for him, he presented to  the emergency department for evaluation.   PAST MEDICAL HISTORY:  1.  Hyperlipidemia.  2.  Arthritis.   PAST SURGICAL HISTORY:  1.  Right rotator cuff repair in October 2005.  2.  Remote lumbar laminectomy.   MEDICATIONS PRIOR TO ADMISSION:  1.  Sular 20 mg q.d.  2.  Benicar HCT 120/12.5 mg q.d.  3.  Pravastatin 80 mg q.d.  4.  Aspirin 1 q.d.  5.  Advil p.r.n.  6.  Glucovance 1.25/250 mg b.i.d.   ALLERGIES:  PENICILLIN and CODEINE.   SOCIAL HISTORY:  Lives alone in [place].  Has two healthy children.  Retired from work as a Midwife.  No history of tobacco use for the  past 30 or 40 years.  Denies excessive use of alcohol.   FAMILY HISTORY:  Mother died at age 55.  She required implantation of  pacemaker prior to that.  Father died at age of 34 of uncertain cause.  No  prominent family history for coronary disease.   REVIEW OF SYSTEMS:  Notable for right shoulder arthralgias.  All other  systems reviewed and are negative.   PHYSICAL EXAMINATION:  GENERAL:  Pleasant, well-appearing, proportionate  gentleman in no acute distress.  VITAL SIGNS:  Temperature 97.4, heart rate 70 and regular, respirations 18,  blood pressure 135/75, weight 205.  O2 saturations 97% on room air.  HEENT:  Anicteric sclerae.  Normal lids and conjunctivae.  NECK:  No jugular venous distention.  Normal carotid upstrokes without  bruits.  ENDOCRINE:  No thyromegaly.  HEMATOPOIETIC:  No adenopathy.  SKIN:  No significant lesions.  CARDIAC:  Normal S1, S2, modest systolic ejection murmur.  LUNGS:  Clear.  ABDOMEN:  Soft, nontender, normal bowel sounds.  No bruits.  No  organomegaly.  No masses.  EXTREMITIES:  Normal distal pulses.  No edema.  NEUROMUSCULAR:  Symmetric strength and tone.  Normal cranial nerves.  MUSCULOSKELETAL:  No joint deformities.   LABORATORY DATA AND X-RAY FINDINGS:  EKG with normal sinus rhythm, right  ventricular conduction delay, early R wave progression, otherwise  unremarkable.   Troponin of 0.12, CPK 373, MB 5.2.  Minimal anemia on CBC with hemoglobin of  11.3.  Metabolic profile is normal.   IMPRESSION/RECOMMENDATIONS:  Mr. Salzman presents with worrisome chest pain  and multiple cardiovascular risk factors.  An initial troponin value of 0.12  is worrisome.  In addition to the beta-blocker he is already receiving, low  molecular weight heparin will be added.  Serial cardiac markers are in  progress.  These will be reviewed as well as the rest of his laboratory  studies in the morning.  I anticipate that coronary angiography will be  necessary.  He has a systolic murmur on exam.  An echocardiogram will be  obtained to evaluate this as well as his chest pain.   Risk  factors appear optimally controlled.  We will continue his excellent  medical management in these areas.      RR/MEDQ  D:  02/27/2005  T:  02/27/2005  Job:  [id number]   cc:   Jackie Plum, M.D.

## 2011-04-27 NOTE — Discharge Summary (Signed)
NAMELOYD, Daniel Reynolds                ACCOUNT NO.:  [id number]   MEDICAL RECORD NO.:  [id number]          PATIENT TYPE:  INP   LOCATION:  4703                         FACILITY:  [facility]   PHYSICIAN:  [place] Bing, M.D.  DATE OF BIRTH:  Sep 25, 1932   DATE OF ADMISSION:  02/27/2005  DATE OF DISCHARGE:  02/28/2005                           DISCHARGE SUMMARY - REFERRING   OPERATION/PROCEDURE:  Cardiac catheterization on February 27, 2005.   REASON FOR ADMISSION:  Mr. Asch is a 77 year old male, with no prior  history of heart disease, but with multiple cardiac risk factors, who  initially presented to [facility] with complaint of chest pain. He  was referred to Dr. [place] Bing for cardiac evaluation.  Please refer to  dictated consultation note for full details.   LABORATORY DATA:  CPK 222/2.5, troponin-I 0.03.  Lipid profile [facility])  showed total cholesterol 172, triglycerides 119, HDL 41, LDL 107, potassium  3.5, glucose 127, BUN 20, creatinine 0.9 at discharge.  Hematocrit 33,  platelets 168 at discharge.  Admission chest x-ray [facility]):  Mild  cardiac enlargement; no acute abnormalities.   HOSPITAL COURSE:  Following initial cardiac evaluation at [facility], where the patient was noted to have mildly elevated cardiac  markers with peak troponin-I, 0.12, but elevated total CPK (370) with  negative relative index, Dr. Dietrich Pates recommended proceeding with definitive  exclusion of coronary artery disease with cardiac catheterization.  Arrangements were thus made to transfer the patient to [facility].   The patient was referred for same-day coronary angiography, performed by Dr.  Daisey Must (see report for full details) revealing moderate two-vessel  coronary artery disease, including the first diagonal artery and the  circumflex artery.  Left ventricular function was normal with no wall  abnormalities.  Dr. Gerri Spore recommended continued  medical therapy.  Plavix  was added.  The patient was kept for overnight observation, approved for  discharge the following morning by Dr. Marianne Bing.   At the time of discharge Dr. Dietrich Pates made additional adjustments.  Metoprolol up-titrated to t.i.d. dosing.  Sular was discontinued.  Aspirin  was down-titrated to 81 daily. Of note, mild hypokalemia (3.5) was repleted  prior to discharge.   DISCHARGE MEDICATIONS:  1.  Plavix 75 mg daily.  2.  Coated aspirin 81 mg daily.  3.  Metoprolol 25 mg t.i.d.  4.  Glucovance 1.25/250 mg b.i.d.  5.  Benicar/hydrochlorothiazide 25/12.5 mg daily.  6.  Pravachol 80 mg at night.  7.  Nitroglycerin 0.4 mg as directed.   DISCHARGE INSTRUCTIONS:  1.  Stop taking Sular.  2.  No strenuous activity for one week.  3.  Maintain low fat, low cholesterol diet.   FOLLOW UP:  The patient is scheduled to follow up with Dr. Erma Pinto P.A.-C. on Monday, April 17, at 1 p.m. at [facility] at [facility]   DISCHARGE DIAGNOSES:  1.  Coronary artery disease.      1.  Moderate two-vessel coronary artery disease by cardiac  catheterization, February 27, 2005.      2.  Normal left ventricular function.  2.  Type 2 diabetes mellitus.  3.  Hypertension.  4.  Dyslipidemia.  5.  Arthritis.      GS/MEDQ  D:  02/28/2005  T:  02/28/2005  Job:  [id number]   cc:   Annia Friendly. Loleta Chance, MD  [address]  [place]  [place] [address]  Fax: [phone]

## 2011-05-03 ENCOUNTER — Encounter: Payer: Self-pay | Admitting: Cardiology

## 2011-11-27 ENCOUNTER — Encounter: Payer: Self-pay | Admitting: Cardiology

## 2011-12-14 ENCOUNTER — Other Ambulatory Visit: Payer: Self-pay | Admitting: *Deleted

## 2011-12-14 MED ORDER — EZETIMIBE 10 MG PO TABS: 10.0000 mg | ORAL_TABLET | Freq: Every day | ORAL | Status: DC

## 2011-12-14 MED ORDER — EZETIMIBE 10 MG PO TABS: 10.0000 mg | ORAL_TABLET | Freq: Every day | ORAL | Status: AC

## 2012-03-13 ENCOUNTER — Encounter: Payer: Self-pay | Admitting: Cardiology

## 2012-03-13 ENCOUNTER — Ambulatory Visit (INDEPENDENT_AMBULATORY_CARE_PROVIDER_SITE_OTHER): Payer: Medicare Other | Admitting: Cardiology

## 2012-03-13 VITALS — BP 124/67 | HR 59 | Ht 70.0 in | Wt 215.0 lb

## 2012-03-13 DIAGNOSIS — I10 Essential (primary) hypertension: Secondary | ICD-10-CM

## 2012-03-13 DIAGNOSIS — I2510 Atherosclerotic heart disease of native coronary artery without angina pectoris: Secondary | ICD-10-CM

## 2012-03-13 DIAGNOSIS — E119 Type 2 diabetes mellitus without complications: Secondary | ICD-10-CM

## 2012-03-13 DIAGNOSIS — E785 Hyperlipidemia, unspecified: Secondary | ICD-10-CM

## 2012-03-13 DIAGNOSIS — E782 Mixed hyperlipidemia: Secondary | ICD-10-CM

## 2012-03-13 DIAGNOSIS — L819 Disorder of pigmentation, unspecified: Secondary | ICD-10-CM

## 2012-03-13 MED ORDER — COLESEVELAM HCL 625 MG PO TABS: 1875.0000 mg | ORAL_TABLET | Freq: Three times a day (TID) | ORAL | Status: DC

## 2012-03-13 NOTE — Assessment & Plan Note (Signed)
Patient is asymptomatic despite known two-vessel coronary disease.  Medical therapy and control of cardiovascular risk factors will continue to be our management strategy.

## 2012-03-13 NOTE — Patient Instructions (Addendum)
**Note De-Identified Almeda Ezra Obfuscation** Your physician has recommended you make the following change in your medication: start taking Welchol 625 mg three times daily  Your physician recommends that you return for lab work in: the week of May 12, 2012  Your physician recommends that you schedule a follow-up appointment in: 1 year

## 2012-03-13 NOTE — Assessment & Plan Note (Addendum)
Patient's most recent hemoglobin A1c level was excellent.  PCP will continue to follow and adjust his medication as required.

## 2012-03-13 NOTE — Assessment & Plan Note (Signed)
Blood pressure control is excellent with current medications, which will be continued. 

## 2012-03-13 NOTE — Assessment & Plan Note (Signed)
Lipid profile is somewhat suboptimal.  WelChol one tablet thrice daily with meals will be added to his medical regime and lipids reassessed in 2 months.

## 2012-03-13 NOTE — Progress Notes (Signed)
Patient ID: Daniel Reynolds, male   DOB: [birth year], 78 y.o.   MRN: [id number]  HPI: Annual return visit for this very nice gentleman with coronary disease and multiple cardiovascular risk factors.  He skipped last year's visit, but has been doing extremely well with respect to heart disease.  He remains active but experiences no dyspnea nor chest discomfort while walking up to 3 miles per day.  Control of blood pressure and diabetes has been excellent.  Management of hyperlipidemia has been more challenging as the result of sensitivity to statins.  Prior to Admission medications   Medication Sig Start Date End Date Taking? Authorizing Provider  aspirin 81 MG tablet Take 81 mg by mouth daily.     Yes Historical Provider, MD  ezetimibe (ZETIA) 10 MG tablet Take 1 tablet (10 mg total) by mouth daily. 12/14/11  Yes Jodelle Gross, NP  glyBURIDE-metformin (GLUCOVANCE) 1.25-250 MG per tablet Take 1 tablet by mouth 2 (two) times daily.     Yes Historical Provider, MD  ibuprofen (ADVIL,MOTRIN) 800 MG tablet Take 800 mg by mouth as needed.     Yes Historical Provider, MD  metoprolol tartrate (LOPRESSOR) 25 MG tablet Take 12.5 mg by mouth 2 (two) times daily.     Yes Historical Provider, MD  olmesartan-hydrochlorothiazide (BENICAR HCT) 40-25 MG per tablet Take 1 tablet by mouth daily.     Yes Historical Provider, MD  pioglitazone-metformin (ACTOPLUS MET) 15-850 MG per tablet Take 1 tablet by mouth daily.     Yes Historical Provider, MD  pravastatin (PRAVACHOL) 20 MG tablet Take 20 mg by mouth daily.     Yes Historical Provider, MD  colesevelam (WELCHOL) 625 MG tablet Take 3 tablets (1,875 mg total) by mouth 3 (three) times daily. 03/13/12 03/13/13  Kathlen Brunswick, MD   No Known Allergies    Past medical history, social history, and family history reviewed and updated.  ROS: Denies orthopnea, PND, palpitations, lightheadedness, syncope or pedal edema.  All other systems reviewed and are negative.  PHYSICAL  EXAM: BP 124/67  Pulse 59  Ht 5\' 10"  (1.778 m)  Wt 97.523 kg (215 lb)  BMI 30.85 kg/m2; 7 pound increase in weight over the past 2 years  General-Well developed; no acute distress Body habitus-Mildly to moderately overweight Neck-No JVD; no carotid bruits Lungs-clear lung fields; resonant to percussion Cardiovascular-normal PMI; normal S1 and S2; modest systolic murmur Abdomen-normal bowel sounds; soft and non-tender without masses or organomegaly Musculoskeletal-No deformities, no cyanosis or clubbing Neurologic-Normal cranial nerves; symmetric strength and tone Skin-Warm, no significant lesions; pigmented macule over the sole of the right foot measuring approximately 1 x 1.5 cm with some variation in color.  Patient believes this has been present for a number of years.  His podiatrist has suggested observation, reserving intervention for enlargement. Extremities-distal pulses intact; Trace edema   ASSESSMENT AND PLAN:  Willacy Bing, MD 03/13/2012 2:28 PM

## 2012-04-07 ENCOUNTER — Other Ambulatory Visit: Payer: Self-pay | Admitting: *Deleted

## 2012-04-07 MED ORDER — COLESEVELAM HCL 625 MG PO TABS: ORAL_TABLET | ORAL | Status: DC

## 2012-07-07 ENCOUNTER — Other Ambulatory Visit: Payer: Self-pay | Admitting: Cardiology

## 2012-07-07 MED ORDER — PRAVASTATIN SODIUM 20 MG PO TABS: 20.0000 mg | ORAL_TABLET | Freq: Every day | ORAL | Status: AC

## 2013-03-18 ENCOUNTER — Encounter: Payer: Self-pay | Admitting: Cardiology

## 2013-03-18 ENCOUNTER — Ambulatory Visit (INDEPENDENT_AMBULATORY_CARE_PROVIDER_SITE_OTHER): Payer: Medicare Other | Admitting: Cardiology

## 2013-03-18 VITALS — BP 132/70 | HR 65 | Ht 70.0 in | Wt 214.1 lb

## 2013-03-18 DIAGNOSIS — I2510 Atherosclerotic heart disease of native coronary artery without angina pectoris: Secondary | ICD-10-CM

## 2013-03-18 DIAGNOSIS — E785 Hyperlipidemia, unspecified: Secondary | ICD-10-CM

## 2013-03-18 DIAGNOSIS — E119 Type 2 diabetes mellitus without complications: Secondary | ICD-10-CM

## 2013-03-18 DIAGNOSIS — I10 Essential (primary) hypertension: Secondary | ICD-10-CM

## 2013-03-18 NOTE — Progress Notes (Deleted)
Name: Daniel Reynolds    DOB: November 22, 1932  Age: 79 y.o.  MR#: [id number]       PCP:  Evlyn Courier, MD      Insurance: Payor: MEDICARE  Plan: MEDICARE PART A AND B  Product Type: *No Product type*    CC:    Chief Complaint  Patient presents with  . Coronary Artery Disease  . Hypertension  list   VS Filed Vitals:   03/18/13 1351  BP: 132/70  Pulse: 65  Height: 5\' 10"  (1.778 m)  Weight: 214 lb 1.9 oz (97.124 kg)    Weights Current Weight  03/18/13 214 lb 1.9 oz (97.124 kg)  03/13/12 215 lb (97.523 kg)  02/22/11 207 lb (93.895 kg)    Blood Pressure  BP Readings from Last 3 Encounters:  03/18/13 132/70  03/13/12 124/67  02/22/11 123/74     Admit date:  (Not on file) Last encounter with RMR:  Visit date not found   Allergy Review of patient's allergies indicates no known allergies.  Current Outpatient Prescriptions  Medication Sig Dispense Refill  . aspirin 81 MG tablet Take 81 mg by mouth daily.        [name] ezetimibe (ZETIA) 10 MG tablet Take 1 tablet (10 mg total) by mouth daily.  30 tablet  6  . glyBURIDE-metformin (GLUCOVANCE) 1.25-250 MG per tablet Take 1 tablet by mouth 2 (two) times daily.        [name] ibuprofen (ADVIL,MOTRIN) 800 MG tablet Take 800 mg by mouth as needed.        . metoprolol tartrate (LOPRESSOR) 25 MG tablet Take 12.5 mg by mouth 2 (two) times daily.        [name] olmesartan-hydrochlorothiazide (BENICAR HCT) 40-25 MG per tablet Take 1 tablet by mouth daily.        . pioglitazone-metformin (ACTOPLUS MET) 15-850 MG per tablet Take 1 tablet by mouth daily.        . pravastatin (PRAVACHOL) 20 MG tablet Take 1 tablet (20 mg total) by mouth daily.  30 tablet  6   No current facility-administered medications for this visit.    Discontinued Meds:    Medications Discontinued During This Encounter  Medication Reason  . colesevelam (WELCHOL) 625 MG tablet Patient Preference    Patient Active Problem List  Diagnosis  . ATHEROSCLEROTIC CARDIOVASCULAR DISEASE  .  ERECTILE DYSFUNCTION, ORGANIC  . Hyperlipidemia  . Hypertension  . Diabetes mellitus, type 2  . Pigmented skin lesion    LABS    Component Value Date/Time   NA 141 10/24/2009 2031   NA 141 10/24/2009   NA 140 11/02/2008   K 4.3 10/24/2009 2031   K 4.3 10/24/2009   K 4.2 11/02/2008   CL 107 10/24/2009 2031   CL 107 10/24/2009   CL 104 11/02/2008   CO2 19 10/24/2009 2031   CO2 19 10/24/2009   CO2 19 11/02/2008   GLUCOSE 109* 10/24/2009 2031   GLUCOSE 109 10/24/2009   GLUCOSE 118 11/02/2008   BUN 30* 10/24/2009 2031   BUN 30 10/24/2009   BUN 28 11/02/2008   CREATININE 1.18 10/24/2009 2031   CREATININE 1.18 10/24/2009   CREATININE 1.07 11/02/2008   CALCIUM 9.3 10/24/2009 2031   CALCIUM 9.3 10/24/2009   CMP     Component Value Date/Time   NA 141 10/24/2009 2031   K 4.3 10/24/2009 2031   CL 107 10/24/2009 2031   CO2 19 10/24/2009 2031   GLUCOSE 109* 10/24/2009 2031  BUN 30* 10/24/2009 2031   CREATININE 1.18 10/24/2009 2031   CALCIUM 9.3 10/24/2009 2031   PROT 7.4 10/24/2009 2031   ALBUMIN 4.7 10/24/2009 2031   AST 26 10/24/2009 2031   ALT 19 10/24/2009 2031   ALKPHOS 38* 10/24/2009 2031   BILITOT 0.6 10/24/2009 2031    No results found for this basename: wbc, hgb, hct, mcv, platelets    Lipid Panel     Component Value Date/Time   CHOL 147 10/24/2009 2031   TRIG 93 10/24/2009 2031   HDL 46 10/24/2009 2031   CHOLHDL 3.2 Ratio 10/24/2009 2031   VLDL 19 10/24/2009 2031   LDLCALC 82 10/24/2009 2031    ABG No results found for this basename: phart, pco2, pco2art, po2, po2art, hco3, tco2, acidbasedef, o2sat     No results found for this basename: TSH   BNP (last 3 results) No results found for this basename: PROBNP,  in the last 8760 hours Cardiac Panel (last 3 results) No results found for this basename: CKTOTAL, CKMB, TROPONINI, RELINDX,  in the last 72 hours  Iron/TIBC/Ferritin No results found for this basename: iron, tibc, ferritin      EKG Orders placed in visit on 02/23/11  . CONVERTED CEMR EKG     Prior Assessment and Plan Problem List as of 03/18/2013     ICD-9-CM   ATHEROSCLEROTIC CARDIOVASCULAR DISEASE   Last Assessment & Plan   03/18/2013 Office Visit Written 03/18/2013  2:32 PM by Kathlen Brunswick, MD     No symptoms at present to suggest myocardial ischemia. We will continue optimal control of cardiovascular risk factors.    ERECTILE DYSFUNCTION, ORGANIC   Hyperlipidemia   Last Assessment & Plan   03/18/2013 Office Visit Written 03/18/2013  2:34 PM by Kathlen Brunswick, MD     Excellent control of hyperlipidemia with current medication, which will be continued.    Hypertension   Last Assessment & Plan   03/18/2013 Office Visit Written 03/18/2013  2:34 PM by Kathlen Brunswick, MD     Blood pressure control appears excellent. Patient is encouraged to monitor at home or in local pharmacies.    Diabetes mellitus, type 2   Last Assessment & Plan   03/18/2013 Office Visit Written 03/18/2013  2:33 PM by Kathlen Brunswick, MD     Diabetic control remains excellent. Recent FBG- 97.    Pigmented skin lesion       Imaging: No results found.

## 2013-03-18 NOTE — Assessment & Plan Note (Signed)
No symptoms at present to suggest myocardial ischemia. We will continue optimal control of cardiovascular risk factors.

## 2013-03-18 NOTE — Assessment & Plan Note (Signed)
Excellent control of hyperlipidemia with current medication, which will be continued. 

## 2013-03-18 NOTE — Progress Notes (Deleted)
Name: Daniel Reynolds    DOB: Sep 13, 1932  Age: 79 y.o.  MR#: [id number]       PCP:  Evlyn Courier, MD      Insurance: Payor: MEDICARE  Plan: MEDICARE PART A AND B  Product Type: *No Product type*    CC:   No chief complaint on file.   VS Filed Vitals:   03/18/13 1351  BP: 132/70  Pulse: 65  Height: 5\' 10"  (1.778 m)  Weight: 214 lb 1.9 oz (97.124 kg)    Weights Current Weight  03/18/13 214 lb 1.9 oz (97.124 kg)  03/13/12 215 lb (97.523 kg)  02/22/11 207 lb (93.895 kg)    Blood Pressure  BP Readings from Last 3 Encounters:  03/18/13 132/70  03/13/12 124/67  02/22/11 123/74     Admit date:  (Not on file) Last encounter with RMR:  Visit date not found   Allergy Review of patient's allergies indicates no known allergies.  Current Outpatient Prescriptions  Medication Sig Dispense Refill  . aspirin 81 MG tablet Take 81 mg by mouth daily.        [name] ezetimibe (ZETIA) 10 MG tablet Take 1 tablet (10 mg total) by mouth daily.  30 tablet  6  . glyBURIDE-metformin (GLUCOVANCE) 1.25-250 MG per tablet Take 1 tablet by mouth 2 (two) times daily.        [name] ibuprofen (ADVIL,MOTRIN) 800 MG tablet Take 800 mg by mouth as needed.        . metoprolol tartrate (LOPRESSOR) 25 MG tablet Take 12.5 mg by mouth 2 (two) times daily.        [name] olmesartan-hydrochlorothiazide (BENICAR HCT) 40-25 MG per tablet Take 1 tablet by mouth daily.        . pioglitazone-metformin (ACTOPLUS MET) 15-850 MG per tablet Take 1 tablet by mouth daily.        . pravastatin (PRAVACHOL) 20 MG tablet Take 1 tablet (20 mg total) by mouth daily.  30 tablet  6   No current facility-administered medications for this visit.    Discontinued Meds:    Medications Discontinued During This Encounter  Medication Reason  . colesevelam (WELCHOL) 625 MG tablet Patient Preference    Patient Active Problem List  Diagnosis  . ATHEROSCLEROTIC CARDIOVASCULAR DISEASE  . ERECTILE DYSFUNCTION, ORGANIC  . Hyperlipidemia  . Hypertension  .  Diabetes mellitus, type 2  . Pigmented skin lesion    LABS    Component Value Date/Time   NA 141 10/24/2009 2031   NA 141 10/24/2009   NA 140 11/02/2008   K 4.3 10/24/2009 2031   K 4.3 10/24/2009   K 4.2 11/02/2008   CL 107 10/24/2009 2031   CL 107 10/24/2009   CL 104 11/02/2008   CO2 19 10/24/2009 2031   CO2 19 10/24/2009   CO2 19 11/02/2008   GLUCOSE 109* 10/24/2009 2031   GLUCOSE 109 10/24/2009   GLUCOSE 118 11/02/2008   BUN 30* 10/24/2009 2031   BUN 30 10/24/2009   BUN 28 11/02/2008   CREATININE 1.18 10/24/2009 2031   CREATININE 1.18 10/24/2009   CREATININE 1.07 11/02/2008   CALCIUM 9.3 10/24/2009 2031   CALCIUM 9.3 10/24/2009   CMP     Component Value Date/Time   NA 141 10/24/2009 2031   K 4.3 10/24/2009 2031   CL 107 10/24/2009 2031   CO2 19 10/24/2009 2031   GLUCOSE 109* 10/24/2009 2031   BUN 30* 10/24/2009 2031   CREATININE 1.18 10/24/2009 2031  CALCIUM 9.3 10/24/2009 2031   PROT 7.4 10/24/2009 2031   ALBUMIN 4.7 10/24/2009 2031   AST 26 10/24/2009 2031   ALT 19 10/24/2009 2031   ALKPHOS 38* 10/24/2009 2031   BILITOT 0.6 10/24/2009 2031    No results found for this basename: wbc, hgb, hct, mcv, platelets    Lipid Panel     Component Value Date/Time   CHOL 147 10/24/2009 2031   TRIG 93 10/24/2009 2031   HDL 46 10/24/2009 2031   CHOLHDL 3.2 Ratio 10/24/2009 2031   VLDL 19 10/24/2009 2031   LDLCALC 82 10/24/2009 2031    ABG No results found for this basename: phart, pco2, pco2art, po2, po2art, hco3, tco2, acidbasedef, o2sat     No results found for this basename: TSH   BNP (last 3 results) No results found for this basename: PROBNP,  in the last 8760 hours Cardiac Panel (last 3 results) No results found for this basename: CKTOTAL, CKMB, TROPONINI, RELINDX,  in the last 72 hours  Iron/TIBC/Ferritin No results found for this basename: iron, tibc, ferritin     EKG Orders placed in visit on 02/23/11  . CONVERTED CEMR EKG     Prior  Assessment and Plan Problem List as of 03/18/2013     ICD-9-CM     Cardiology Problems   ATHEROSCLEROTIC CARDIOVASCULAR DISEASE   Last Assessment & Plan   03/13/2012 Office Visit Written 03/13/2012  2:33 PM by Kathlen Brunswick, MD     Patient is asymptomatic despite known two-vessel coronary disease.  Medical therapy and control of cardiovascular risk factors will continue to be our management strategy.    Hyperlipidemia   Last Assessment & Plan   03/13/2012 Office Visit Written 03/13/2012  2:35 PM by Kathlen Brunswick, MD     Lipid profile is somewhat suboptimal.  WelChol one tablet thrice daily with meals will be added to his medical regime and lipids reassessed in 2 months.    Hypertension   Last Assessment & Plan   03/13/2012 Office Visit Written 03/13/2012  2:36 PM by Kathlen Brunswick, MD     Blood pressure control is excellent with current medications, which will be continued.      Other   ERECTILE DYSFUNCTION, ORGANIC   Diabetes mellitus, type 2   Last Assessment & Plan   03/13/2012 Office Visit Edited 03/13/2012  2:37 PM by Kathlen Brunswick, MD     Patient's most recent hemoglobin A1c level was excellent.  PCP will continue to follow and adjust his medication as required.    Pigmented skin lesion       Imaging: No results found.

## 2013-03-18 NOTE — Progress Notes (Signed)
Patient ID: Daniel Reynolds, male   DOB: [birth year], 79 y.o.   MRN: [id number]  HPI: Schedule return visit for this very pleasant gentleman with coronary disease and multiple cardiovascular risk factors. Hypertension and hyperlipidemia remain under excellent control.  Current Outpatient Prescriptions  Medication Sig Dispense Refill  . aspirin 81 MG tablet Take 81 mg by mouth daily.        [name] ezetimibe (ZETIA) 10 MG tablet Take 1 tablet (10 mg total) by mouth daily.  30 tablet  6  . glyBURIDE-metformin (GLUCOVANCE) 1.25-250 MG per tablet Take 1 tablet by mouth 2 (two) times daily.        [name] ibuprofen (ADVIL,MOTRIN) 800 MG tablet Take 800 mg by mouth as needed.        . metoprolol tartrate (LOPRESSOR) 25 MG tablet Take 12.5 mg by mouth 2 (two) times daily.        [name] olmesartan-hydrochlorothiazide (BENICAR HCT) 40-25 MG per tablet Take 1 tablet by mouth daily.        . pioglitazone-metformin (ACTOPLUS MET) 15-850 MG per tablet Take 1 tablet by mouth daily.        . pravastatin (PRAVACHOL) 20 MG tablet Take 1 tablet (20 mg total) by mouth daily.  30 tablet  6   No current facility-administered medications for this visit.   No Known Allergies   Past medical history, social history, and family history reviewed and updated.  ROS: Denies chest pain, dyspnea, orthopnea, PND, peripheral edema, lightheadedness or syncope. All other systems reviewed and are negative.  PHYSICAL EXAM: BP 132/70  Pulse 65  Ht 5\' 10"  (1.778 m)  Wt 97.124 kg (214 lb 1.9 oz)  BMI 30.72 kg/m2;  Body mass index is 30.72 kg/(m^2). General-Well developed; no acute distress Body habitus-mildly overweight Neck-No JVD; no carotid bruits Lungs-clear lung fields; resonant to percussion Cardiovascular-normal PMI; normal S1 and S2; occasional premature Abdomen-normal bowel sounds; soft and non-tender without masses or organomegaly Musculoskeletal-No deformities, no cyanosis or clubbing Neurologic-Normal cranial nerves; symmetric  strength and tone Skin-Warm, no significant lesions Extremities-distal pulses intact; no edema  Daniel Bing, MD 03/18/2013  2:30 PM  ASSESSMENT AND PLAN

## 2013-03-18 NOTE — Assessment & Plan Note (Signed)
Diabetic control remains excellent. Recent FBG- 97.

## 2013-03-18 NOTE — Patient Instructions (Addendum)
Your physician recommends that you schedule a follow-up appointment in: 1 year  

## 2013-03-18 NOTE — Assessment & Plan Note (Signed)
Blood pressure control appears excellent. Patient is encouraged to monitor at home or in local pharmacies.

## 2014-02-14 ENCOUNTER — Emergency Department (HOSPITAL_COMMUNITY): Payer: Medicare Other

## 2014-02-14 ENCOUNTER — Encounter (HOSPITAL_COMMUNITY): Payer: Self-pay | Admitting: Emergency Medicine

## 2014-02-14 ENCOUNTER — Emergency Department (HOSPITAL_COMMUNITY)
Admission: EM | Admit: 2014-02-14 | Discharge: 2014-02-14 | Disposition: A | Discharge status: Home / Self Care | Payer: Medicare Other | Attending: Emergency Medicine | Admitting: Emergency Medicine

## 2014-02-14 DIAGNOSIS — K089 Disorder of teeth and supporting structures, unspecified: Secondary | ICD-10-CM | POA: Insufficient documentation

## 2014-02-14 DIAGNOSIS — Z87448 Personal history of other diseases of urinary system: Secondary | ICD-10-CM | POA: Insufficient documentation

## 2014-02-14 DIAGNOSIS — K0889 Other specified disorders of teeth and supporting structures: Secondary | ICD-10-CM

## 2014-02-14 DIAGNOSIS — R221 Localized swelling, mass and lump, neck: Secondary | ICD-10-CM

## 2014-02-14 DIAGNOSIS — E785 Hyperlipidemia, unspecified: Secondary | ICD-10-CM | POA: Insufficient documentation

## 2014-02-14 DIAGNOSIS — Z87891 Personal history of nicotine dependence: Secondary | ICD-10-CM | POA: Insufficient documentation

## 2014-02-14 DIAGNOSIS — Z880 Allergy status to penicillin: Secondary | ICD-10-CM | POA: Insufficient documentation

## 2014-02-14 DIAGNOSIS — Z7982 Long term (current) use of aspirin: Secondary | ICD-10-CM | POA: Insufficient documentation

## 2014-02-14 DIAGNOSIS — I10 Essential (primary) hypertension: Secondary | ICD-10-CM | POA: Insufficient documentation

## 2014-02-14 DIAGNOSIS — R220 Localized swelling, mass and lump, head: Secondary | ICD-10-CM

## 2014-02-14 DIAGNOSIS — E119 Type 2 diabetes mellitus without complications: Secondary | ICD-10-CM | POA: Insufficient documentation

## 2014-02-14 DIAGNOSIS — Z79899 Other long term (current) drug therapy: Secondary | ICD-10-CM | POA: Insufficient documentation

## 2014-02-14 LAB — I-STAT CHEM 8, ED
BUN: 31 mg/dL — AB (ref 6–23)
CREATININE: 1.7 mg/dL — AB (ref 0.50–1.35)
Calcium, Ion: 1.3 mmol/L (ref 1.13–1.30)
Chloride: 100 mEq/L (ref 96–112)
Glucose, Bld: 173 mg/dL — ABNORMAL HIGH (ref 70–99)
HCT: 36 % — ABNORMAL LOW (ref 39.0–52.0)
Hemoglobin: 12.2 g/dL — ABNORMAL LOW (ref 13.0–17.0)
Potassium: 3.5 mEq/L — ABNORMAL LOW (ref 3.7–5.3)
SODIUM: 138 meq/L (ref 137–147)
TCO2: 23 mmol/L (ref 0–100)

## 2014-02-14 MED ORDER — SODIUM CHLORIDE 0.9 % IV SOLN
INTRAVENOUS | Status: DC
  Administered 2014-02-14: 15:00:00 via INTRAVENOUS

## 2014-02-14 MED ORDER — CLINDAMYCIN PHOSPHATE 600 MG/50ML IV SOLN
600.0000 mg | Freq: Once | INTRAVENOUS | Status: AC
  Administered 2014-02-14: 600 mg via INTRAVENOUS
  Filled 2014-02-14: qty 50

## 2014-02-14 MED ORDER — CLINDAMYCIN HCL 150 MG PO CAPS: ORAL_CAPSULE | ORAL | Status: DC

## 2014-02-14 NOTE — ED Provider Notes (Signed)
CSN: [id number]     Arrival date & time 02/14/14  1311 History   First MD Initiated Contact with Patient 02/14/14 1346     Chief Complaint  Patient presents with  . Dental Pain      HPI Pt was seen at 1350. Per pt, c/o gradual onset and persistence of constant left lower tooth "pain" for the past 2 weeks.  Has been associated with intermittent left sided lower facial "swelling." Pain worsens "when I chew food on that side."  States he was seen by his dentist x2 for same, initially told "my gum had moved away from the tooth" and "got a chip with medicine placed in the pocket." Pt states his symptoms continued, so he went back to the dentist "and he pulled the chip and the tooth." States his symptoms continue and he "doesn't want to go back to the dentist," so he came to the ED for evaluation. Pt has not been on antibiotics. Denies fevers, no intra-oral edema, no rash, no dysphagia, no neck pain.   The condition is aggravated by nothing. The condition is relieved by nothing. The symptoms have been associated with no other complaints.    Past Medical History  Diagnosis Date  . Arteriosclerotic cardiovascular disease (ASCVD)     Non Q myocardial infarction in 02/2005; 80% small first diagnoal stenosis, 60% circumflex; normal EF  . Erectile dysfunction   . Acute renal injury     Transient; creatinine elevation;, returned to 1.0 in 07/2008  . 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 Hypertension   . Diabetes mellitus, type 2     no insulin, A1c level of 5.7 in 2009   Past Surgical History  Procedure Laterality Date  . Lumbar laminectomy  1982  . Rotator cuff repair  2005    Right  . Colonoscopy  2008    History  Substance Use Topics  . Smoking status: Former Smoker -- 0.50 packs/day for 8 years    Types: Cigarettes    Quit date: 12/10/1973  . Smokeless tobacco: Never Used  . Alcohol Use: No    Review of Systems ROS: Statement: All systems negative except as  marked or noted in the HPI; Constitutional: Negative for fever and chills. ; ; Eyes: Negative for eye pain and discharge. ; ; ENMT: Positive for dental caries, dental hygiene poor, toothache, intermittent facial "swelling." Negative for ear pain, bleeding gums, dental injury, facial deformity, hoarseness, nasal congestion, sinus pressure, sore throat, throat swelling and tongue swollen. ; ; Cardiovascular: Negative for chest pain, palpitations, diaphoresis, dyspnea and peripheral edema. ; ; Respiratory: Negative for cough, wheezing and stridor. ; ; Gastrointestinal: Negative for nausea, vomiting, diarrhea and abdominal pain. ; ; Genitourinary: Negative for dysuria, flank pain and hematuria. ; ; Musculoskeletal: Negative for back pain and neck pain. ; ; Skin: Negative for rash and skin lesion. ; ; Neuro: Negative for headache, lightheadedness and neck stiffness.      Allergies  Penicillins  Home Medications   Current Outpatient Rx  Name  Route  Sig  Dispense  Refill  . acetaminophen (TYLENOL) 500 MG tablet   Oral   Take 1,000 mg by mouth every 6 (six) hours as needed for mild pain.         [name] aspirin 81 MG tablet   Oral   Take 81 mg by mouth daily.           [name] ezetimibe (ZETIA) 10 MG tablet  Oral   Take 1 tablet (10 mg total) by mouth daily.   30 tablet   6   . glyBURIDE-metformin (GLUCOVANCE) 1.25-250 MG per tablet   Oral   Take 1 tablet by mouth 2 (two) times daily.           [name] ibuprofen (ADVIL,MOTRIN) 800 MG tablet   Oral   Take 800 mg by mouth every 8 (eight) hours as needed for moderate pain.          . metoprolol tartrate (LOPRESSOR) 25 MG tablet   Oral   Take 12.5 mg by mouth 2 (two) times daily.           [name] olmesartan-hydrochlorothiazide (BENICAR HCT) 40-25 MG per tablet   Oral   Take 1 tablet by mouth daily.           . pioglitazone-metformin (ACTOPLUS MET) 15-850 MG per tablet   Oral   Take 1 tablet by mouth daily.           . pravastatin  (PRAVACHOL) 20 MG tablet   Oral   Take 1 tablet (20 mg total) by mouth daily.   30 tablet   6    BP 124/67  Pulse 74  Temp(Src) 98.5 F (36.9 C) (Oral)  Resp 18  Ht $R'5\' 10"'Yu$  (1.778 m)  Wt 200 lb (90.719 kg)  BMI 28.70 kg/m2  SpO2 97% Physical Exam 1355: Physical examination:  Nursing notes reviewed; Vital signs and O2 SAT reviewed;  Constitutional: Well developed, Well nourished, Well hydrated, In no acute distress; Head:  Normocephalic, atraumatic. No facial edema, no rash.; Eyes: EOMI, PERRL, No scleral icterus; ENMT: TM's clear bilat. +edemetous nasal turbinates bilat with clear rhinorrhea. Mouth and pharynx without lesions. No tonsillar exudates. No intra-oral edema. +mild lower mandibular edema without overlying erythema, no soft tissue crepitus. No sublingual edema.  No pain with manipulation of larynx. Mouth and pharynx normal, Mucous membranes moist. Poor dentition, Widespread dental decay,+lower left 2nd molar s/p extraction, gingival wound edges well approximated and without erythema, drainage. +lower left 1st molar with extensive dental decay.  No gingival erythema, edema, fluctuance, or drainage. No trismus. No hoarse voice, no drooling, no stridor.; Neck: Supple, Full range of motion, No lymphadenopathy; Cardiovascular: Regular rate and rhythm, No gallop; Respiratory: Breath sounds clear & equal bilaterally, No rales, rhonchi, wheezes.  Speaking full sentences with ease, Normal respiratory effort/excursion; Chest: Nontender, Movement normal; Abdomen: Soft, Nontender, Nondistended, Normal bowel sounds; Genitourinary: No CVA tenderness; Extremities: Pulses normal, No tenderness, No edema, No calf edema or asymmetry.; Neuro: AA&Ox3, Major CN grossly intact.  Speech clear. No gross focal motor or sensory deficits in extremities. Climbs on and off stretcher easily by himself. Gait steady.; Skin: Color normal, Warm, Dry.   ED Course  Procedures     EKG Interpretation None      MDM   MDM Reviewed: previous chart, nursing note and vitals Reviewed previous: labs Interpretation: labs and CT scan   Results for orders placed during the hospital encounter of 02/14/14  I-STAT CHEM 8, ED      Result Value Ref Range   Sodium 138  137 - 147 mEq/L   Potassium 3.5 (*) 3.7 - 5.3 mEq/L   Chloride 100  96 - 112 mEq/L   BUN 31 (*) 6 - 23 mg/dL   Creatinine, Ser 1.70 (*) 0.50 - 1.35 mg/dL   Glucose, Bld 173 (*) 70 - 99 mg/dL   Calcium, Ion 1.30  1.13 - 1.30 mmol/L  TCO2 23  0 - 100 mmol/L   Hemoglobin 12.2 (*) 13.0 - 17.0 g/dL   HCT 36.0 (*) 39.0 - 52.0 %   Ct Soft Tissue Neck Wo Contrast 02/14/2014   CLINICAL DATA:  Bilateral face and neck swelling for 2 weeks status post dental work  EXAM: CT NECK WITHOUT CONTRAST  TECHNIQUE: Multidetector CT imaging of the neck was performed following the standard protocol without intravenous contrast.  COMPARISON:  None.  FINDINGS: There is mildly increased density in the subcutaneous fat over the mandible bilaterally extending inferiorly into the upper neck. There is no discrete abscess. No bulky submandibular lymph nodes are demonstrated. The mandible exhibits no lytic or blastic lesion. There is a large amount of beam hardening artifact from the patient's dental work. On images 29-32 there is gas adjacent to the right maxillary alveolar ridge and remaining molars which may be the site of dental extraction.  The observed portions of the paranasal sinuses are clear. The tongue base structures appear normal. The parotid and submandibular glands exhibit no acute abnormalities. The hypopharynx exhibits no acute abnormality. The laryngeal structures are normal in appearance. The thyroid lobes do not appear abnormally enlarged. The jugular and carotid vessels appear normal. The pulmonary apices exhibit no acute abnormalities.  The thoracic vertebral bodies are preserved in height. There is mild loss of the normal cervical lordosis.  IMPRESSION: 1. There is  no evidence of a soft tissue abscess adjacent to the maxilla or mandible. There is increased density in the subcutaneous fat bilaterally over the mandible extending into the upper neck which may reflect cellulitis or simple edema. No drainable fluid collections are demonstrated. There are a few submandibular and submental lymph nodes demonstrated. 2. The observed portions of the paranasal sinuses are clear. The nasal and oral airways are patent and the hypopharynx exhibits no acute abnormality. 3. There is loss of the normal cervical lordosis which may reflect muscle spasm related to pain.   Electronically Signed   By: David  Martinique   On: 02/14/2014 15:43    1645:  Mild edema of SQ fat over mandible on CT, no abscess. Will tx with clindamycin; give 1st dose here; encouraged to f/u with dentist.  BUN/Cr elevated from previous today (2012: Cr 1.3); will need PMD f/u.  Pt wants to go home now. Dx and testing d/w pt and family.  Questions answered.  Verb understanding, agreeable to d/c home with outpt f/u.   Alfonzo Feller, DO 02/17/14 1332

## 2014-02-14 NOTE — ED Notes (Addendum)
Per patient dental pain started 2 weeks ago and went to dentist. Per patient dentist took x-rays "placed a chip between gum and tooth" on left lower side. Per patient made pain worse and caused facial swelling, causing difficulty in swallowing. Per patient returned to dentist and dentist pulled tooth out. Patient reports slight decrease in swelling but increase in pain. Patient reports pain radiates into throat. Patient reports difficulty with swallowing food. Patient denies dentist giving him any antibiotics. Patient also c/o headache since tooth removed. Patient also reports getting shingles vaccine giving 2 weeks ago prior to pain.

## 2014-02-14 NOTE — Discharge Instructions (Signed)
°Emergency Department Resource Guide °1) Find a Doctor and Pay Out of Pocket °Although you won't have to find out who is covered by your insurance plan, it is a good idea to ask around and get recommendations. You will then need to call the office and see if the doctor you have chosen will accept you as a new patient and what types of options they offer for patients who are self-pay. Some doctors offer discounts or will set up payment plans for their patients who do not have insurance, but you will need to ask so you aren't surprised when you get to your appointment. ° °2) Contact Your Local Health Department °Not all health departments have doctors that can see patients for sick visits, but many do, so it is worth a call to see if yours does. If you don't know where your local health department is, you can check in your phone book. The CDC also has a tool to help you locate your state's health department, and many state websites also have listings of all of their local health departments. ° °3) Find a Walk-in Clinic °If your illness is not likely to be very severe or complicated, you may want to try a walk in clinic. These are popping up all over the country in pharmacies, drugstores, and shopping centers. They're usually staffed by nurse practitioners or physician assistants that have been trained to treat common illnesses and complaints. They're usually fairly quick and inexpensive. However, if you have serious medical issues or chronic medical problems, these are probably not your best option. ° °No Primary Care Doctor: °- Call Health Connect at  832-8000 - they can help you locate a primary care doctor that  accepts your insurance, provides certain services, etc. °- Physician Referral Service- 1-800-533-3463 ° °Chronic Pain Problems: °Organization         Address  Phone   Notes  °[facility]  ([phone]) [phone] Patients need to be referred by their primary care doctor.  ° °Medication  Assistance: °Organization         Address  Phone   Notes  °[place] [facility] [address] °[place], [place] [address] (336) 641-8030 --Must be a resident of [place] °-- Must have NO insurance coverage whatsoever (no Medicaid/ Medicare, etc.) °-- The pt. MUST have a primary care doctor that directs their care regularly and follows them in the community °  °[facility]  (866) 331-1348   °[employer]  (888) 892-1162   ° °Agencies that provide inexpensive medical care: °Organization         Address  Phone   Notes  °[facility]  (336) 832-8035   °[facility]    (336) 832-7272   °[facility] [address] °[place], [place] [address] (336) 832-4777   °[facility] [address], °[place] (336) 271-4999   °[facility]    (336) 373-0678   °[facility]    (336) 272-1050   °[facility] ° [address], [place] Phone:  (336) 832-4444, Fax:  (336) 832-4440 Hours of Operation:  9 am - 6 pm, M-F.  Also accepts Medicaid/Medicare and self-pay.  °[facility] ° [address], [place] Phone: (336) 832-3150, Fax: (336) [phone]. Hours of Operation:  8:30 am - 5:30 pm, M-F.  Also accepts Medicaid and self-pay.  °[facility] [place] [address], [place] Phone: (336) 878-6027   °[facility] [address], [place], [place] (336)723-1848, Ext. 123 Mondays & Thursdays: 7-9 AM.  First 15 patients are seen on a first come, first serve basis. °  ° °Medicaid-accepting [place] Providers: ° °Organization         Address  Phone   Notes  °[facility] [address], [place] (336) 641-2100 Also accepts self-pay patients.  °[facility] [address], [place] ° (336) 856-9996   °[facility] [address], [place]  (336) 288-8857   °[facility] [address] [place] [address], [place] (336) 299-7000   °Veita Bland [address], [place]  ° (336) 373-1557 Only accepts [place] Access Medicaid patients after they have their name applied to their card.  ° °Self-Pay (no insurance) in [place]: ° °Organization         Address  Phone   Notes  °Sickle Cell Patients, [facility] [address], [place] (336) 832-1970   °[facility] [address], [place] (336) 832-4400   °[facility] ° [address], [place] (336) 992-4800   °[facility]/Dr. Osei-Bonsu ° [address] [place] [address], [place] or [address], [place] (336) [phone] Phone number for both [place] and [place] locations is the same.  °[facility] [address], [place] (336) 299-0000   °[facility] [address] [place] [address], [place] or [address] (336) 852-7530 °(336) 878-2260   °[facility] [address], [place] (336) 350-1642, phone; (336) 294-5005, fax Sees patients 1st and 3rd Saturday of every month.  Must not qualify for public or private insurance (i.e. Medicaid, Medicare, [place] Health Choice, Veterans' Benefits) • Household income should be no more than 200% of the poverty level •The clinic cannot treat you if you are pregnant or think you are pregnant • Sexually transmitted diseases are not treated at the clinic.  ° ° °Dental Care: °Organization         Address  Phone  Notes  °[place] [facility] [address], [place] (336) 641-6152 Accepts children up to age 21 who are enrolled in Medicaid or [place] Health Choice; pregnant women with a Medicaid card; and children who have applied for Medicaid or [place] Health Choice, but were declined, whose parents can pay a reduced fee at time of service.  °[place]  [facility] [place]  [address], [place] (336) 641-7733 Accepts children up to age 21 who are enrolled in Medicaid or [place] Health Choice; pregnant women with a Medicaid card; and children who have applied for Medicaid or [place] Health Choice, but were declined, whose parents can pay a reduced fee at time of service.  °[facility] ° [address], [place] (336) 641-4533 Patients are seen by appointment only. Walk-ins are not accepted. [facility] will see patients 18 years of age and older. °Monday - Tuesday (8am-5pm) °Most Wednesdays (8:30-5pm) °$30 per visit, cash only  °[facility] ° [address], [place] (336) 641-4533 Patients are seen by appointment only. Walk-ins are not accepted. [facility] will see patients 18 years of age and older. °One   Wednesday Evening (Monthly: Volunteer Based).  $30 per visit, cash only  °[facility]  (919) 537-3737 for adults; Children under age 4, call Graduate Pediatric Dentistry at ([phone]) [phone]. Children aged 4-14, please call ([phone]) [phone] to request a pediatric application. ° Dental services are provided in all areas of dental care including fillings, crowns and bridges, complete and partial dentures, implants, gum treatment, root canals, and extractions. Preventive care is also provided. Treatment is provided to both adults and children. °Patients are selected via a lottery and there is often a waiting list. °  °[facility] [address], °[place] ° (336) 763-8833 [email or web address] °  °[facility] [address], [place], [place] (336)723-1848, Ext. 123 Second and Fourth Thursday of each month, opens at 6:30 AM; Clinic ends at 9 AM.  Patients are seen on a first-come first-served basis, and a limited number are seen during each clinic.  ° °[facility] ° [address], [place], [place] (336) 723-7904    Eligibility Requirements °You must have lived in [place], [place], or [place] counties for at least the last three months. °  You cannot be eligible for state or federal sponsored healthcare insurance, including Veterans Administration, Medicaid, or Medicare. °  You generally cannot be eligible for healthcare insurance through your employer.  °  How to apply: °Eligibility screenings are held every Tuesday and Wednesday afternoon from 1:00 pm until 4:00 pm. You do not need an appointment for the interview!  °[facility] [address], [place], [place] 336-631-2330   °[facility]  336-342-8273   °[facility]  336-703-3100   °[facility]  336-570-6415   ° °Behavioral Health Resources in the Community: °Intensive Outpatient Programs °Organization         Address  Phone  Notes  °[facility] [address], [place], [place] 336-878-6098   °[facility] [address], [place], [place] 336-832-9800   °[facility] [address], [place], [place] ° 336-882-2125   °[facility] [address],  °[place], [place] 1-800-853-5163 or 336-641-4981   °Substance Abuse Resources °Organization         Address  Phone  Notes  °[facility]  336-882-2125   °[facility]  336-784-9470   °[facility]  336-285-9073   °[facility]  336-845-3988   °Residential & Outpatient Substance Abuse Program  1-800-659-3381   °Psychological Services °Organization         Address  Phone  Notes  °[facility]  336- 832-9600   °[facility]  336- 378-7881   °[facility] [address], [place] 1-800-853-5163 or 336-641-4981   ° °Mobile Crisis Teams °Organization         Address  Phone  Notes  °[facility], Mobile Crisis Care Unit  1-877-626-1772   °Assertive °[facility] ° [address]  [place], [place] 336-834-9664   °Sharon DeEsch [address] °[place] 336-554-5454   ° °Self-Help/Support Groups °Organization         Address  Phone             Notes  °[facility] of  - variety of support groups  336- 373-1402 Call for more information  °Narcotics Anonymous (NA), [facility] [address], °[place]  2 meetings at this location  ° °  Residential Treatment Programs Organization         Address  Phone  Notes  [facility] [address],    [place]  [phone]   [facility]  [address], [place] [phone], [place], [place] [phone]   [facility] [address], [place] [phone] Admissions: 8am-3pm M-F  [facility] [address]    [place], [place] [phone]   [facility] [address], [place], [place] [phone]   [facility] [address]  [place], [place] [phone]) [phone]   [facility] Intensive Outpatient [address], [place], [place] [phone]   [facility] [address]  [place], [place] [phone] or [phone]   [facility] [address] [place], [place] 323-557-3220(819) [phone] Accepts Medicaid  [facility] [address]  [place] [phone]-([phone]) [phone] Substance Abuse/Addiction Treatment   [facility] Resources Organization         Address  Phone  Notes  [facility]  (864)574-0985(888) (510)224-5172   Angie FavaJulie Brannon, PhD [address], [address] [place], [place]   [phone](336) [phone] or [phone](336) [phone]   [facility]    [address] [place], [place] ([phone](336) [phone]   [facility] [address], [place], [place] (623) [phone](336) [phone] Insurance/Medicaid/sponsorship through [facility] [address]                                    [place], [place] [phone](336) [phone] Therapy/tele-psych/case    [facility] [address]   [place], [place] [phone]) [phone]    Dr. Lolly MustacheArfeen  [phone]) [phone]   [facility]  [employer] [facility] 1) [address], [place] 2) [address], [place] 3)  [address], [place] [phone](336) [phone](336) [phone]  [phone](336) [phone]   [facility] Child Abuse Hotline (559) 576-2476(336) 934-069-9990 or (720) 776-3270(336) (270)[phone] (After Hours)      Take the prescription as directed.  Call your regular medical doctor and your Dentist tomorrow morning to schedule a follow up appointment within the next 2 days.  Return to the Emergency Department immediately sooner if worsening.

## 2015-05-03 ENCOUNTER — Encounter: Payer: Self-pay | Admitting: Cardiovascular Disease

## 2015-05-03 ENCOUNTER — Ambulatory Visit (INDEPENDENT_AMBULATORY_CARE_PROVIDER_SITE_OTHER): Payer: Self-pay | Admitting: Cardiovascular Disease

## 2015-05-03 VITALS — BP 112/64 | HR 63 | Ht 70.0 in | Wt 209.0 lb

## 2015-05-03 DIAGNOSIS — I2510 Atherosclerotic heart disease of native coronary artery without angina pectoris: Secondary | ICD-10-CM

## 2015-05-03 DIAGNOSIS — I252 Old myocardial infarction: Secondary | ICD-10-CM

## 2015-05-03 DIAGNOSIS — E785 Hyperlipidemia, unspecified: Secondary | ICD-10-CM

## 2015-05-03 DIAGNOSIS — Z136 Encounter for screening for cardiovascular disorders: Secondary | ICD-10-CM | POA: Diagnosis not present

## 2015-05-03 DIAGNOSIS — I10 Essential (primary) hypertension: Secondary | ICD-10-CM

## 2015-05-03 NOTE — Progress Notes (Signed)
Patient ID: Daniel Reynolds, male   DOB: 06/22/1932, 81 y.o.   MRN: [id number]      SUBJECTIVE: The patient is an 81 year old male with a history of coronary artery disease and a prior non-ST elevation myocardial infarction in 2006. He also has hypertension, hyperlipidemia, and diabetes mellitus. He was last evaluated by Dr. Lattie Haw in April 2014.  Coronary angiography on 02/27/05 demonstrated ostial left main 30% stenosis, 20% stenosis in the proximal and mid LAD, tubular 80% stenosis in a diagonal branch less than 2 mm in diameter, 40% ostial circumflex stenosis, irregular 60-70% mid circumflex stenosis, 50% ostial OM2 stenosis, 60-70% proximal OM 3 stenosis, and a small codominant RCA. Left ventricular systolic function was normal by echocardiogram. He was managed medically.  He feels well and denies chest pain, palpitations, lightheadedness, and shortness of breath. He says he actually didn't have a heart attack. He walks 3 miles daily and has done so for 35 years.   ECG performed in the office today demonstrates sinus rhythm with a first-degree AV block, PR interval 244 ms, and a right bundle branch block, QRS duration 140 ms.     Review of Systems: As per "subjective", otherwise negative.  Allergies  Allergen Reactions  . Penicillins Anaphylaxis    Current Outpatient Prescriptions  Medication Sig Dispense Refill  . acetaminophen (TYLENOL) 500 MG tablet Take 1,000 mg by mouth every 6 (six) hours as needed for mild pain.    [name] aspirin 81 MG tablet Take 81 mg by mouth daily.      [name] ezetimibe (ZETIA) 10 MG tablet Take 1 tablet (10 mg total) by mouth daily. 30 tablet 6  . glyBURIDE-metformin (GLUCOVANCE) 1.25-250 MG per tablet Take 1 tablet by mouth 2 (two) times daily.      [name] ibuprofen (ADVIL,MOTRIN) 800 MG tablet Take 800 mg by mouth every 8 (eight) hours as needed for moderate pain.     . metoprolol succinate (TOPROL-XL) 25 MG 24 hr tablet Take 25 mg by mouth daily.  3  .  olmesartan-hydrochlorothiazide (BENICAR HCT) 40-25 MG per tablet Take 1 tablet by mouth daily.      . pioglitazone-metformin (ACTOPLUS MET) 15-850 MG per tablet Take 1 tablet by mouth daily.      . pravastatin (PRAVACHOL) 20 MG tablet Take 1 tablet (20 mg total) by mouth daily. 30 tablet 6   No current facility-administered medications for this visit.    Past Medical History  Diagnosis Date  . Arteriosclerotic cardiovascular disease (ASCVD)     Non Q myocardial infarction in 02/2005; 80% small first diagnoal stenosis, 60% circumflex; normal EF  . Erectile dysfunction   . Acute renal injury     Transient; creatinine elevation;, returned to 1.0 in 07/2008  . 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 Hypertension   . Diabetes mellitus, type 2     no insulin, A1c level of 5.7 in 2009    Past Surgical History  Procedure Laterality Date  . Lumbar laminectomy  1982  . Rotator cuff repair  2005    Right  . Colonoscopy  2008    History   Social History  . Marital Status: Married    Spouse Name: N/A  . Number of Children: N/A  . Years of Education: N/A   Occupational History  . Retired     Previously employed as a Quarry manager   Social History Main Topics  . Smoking status: Former Smoker -- 0.50 packs/day for  8 years    Types: Cigarettes    Quit date: 12/10/1973  . Smokeless tobacco: Never Used  . Alcohol Use: No  . Drug Use: No  . Sexual Activity: Not on file   Other Topics Concern  . Not on file   Social History Narrative   Divorced   Gets regular exercise     Filed Vitals:   05/03/15 1102  BP: 112/64  Pulse: 63  Height: $Remove'5\' 10"'zKGrqzG$  (1.778 m)  Weight: 209 lb (94.802 kg)  SpO2: 97%    PHYSICAL EXAM General: NAD HEENT: Normal. Neck: No JVD, no thyromegaly. Lungs: Clear to auscultation bilaterally with normal respiratory effort. CV: Nondisplaced PMI.  Regular rate and rhythm, normal S1/S2, no I6/N6, soft 1/6 systolic murmur over RUSB. No  pretibial or periankle edema.  No carotid bruit.   Abdomen: Soft, nontender, no distention.  Neurologic: Alert and oriented x 3.  Psych: Normal affect. Skin: Normal. Musculoskeletal: Normal range of motion, no gross deformities. Extremities: No clubbing or cyanosis.   ECG: Most recent ECG reviewed.      ASSESSMENT AND PLAN: 1. CAD with h/o NSTEMI managed medically: Cath results from 2006 noted above. Symptomatically stable. Exercises daily. Continue current medical therapy with aspirin 81 mg, Zetia 10 mg, Toprol-XL 25 mg, Benicar, and pravastatin 20 mg. He reportedly developed myositis with Lipitor in the past.  2. Essential HTN: Well controlled on Benicar-HCTZ 40-25 mg. No changes.  3. Hyperlipidemia: Will check lipids. Continue Zetia and pravastatin at current doses.  Dispo: f/u 1 year.   Kate Sable, M.D., F.A.C.C.

## 2015-05-03 NOTE — Patient Instructions (Signed)
Your physician wants you to follow-up in: 1 year with Dr Reggy EyeKoneswaran You will receive a reminder letter in the mail two months in advance. If you don't receive a letter, please call our office to schedule the follow-up appointment.     Your physician recommends that you continue on your current medications as directed. Please refer to the Current Medication list given to you today.    Please get FASTING cholesterol     Thank you for choosing [facility] !

## 2015-05-04 LAB — LIPID PANEL
Cholesterol: 193 mg/dL (ref 0–200)
HDL: 51 mg/dL (ref >=40)
LDL Cholesterol: 121 mg/dL — ABNORMAL HIGH (ref 0–99)
Total CHOL/HDL Ratio: 3.8 Ratio
Triglycerides: 107 mg/dL (ref <150)
VLDL: 21 mg/dL (ref 0–40)

## 2015-05-05 ENCOUNTER — Telehealth: Payer: Self-pay

## 2015-05-05 DIAGNOSIS — E785 Hyperlipidemia, unspecified: Secondary | ICD-10-CM

## 2015-05-05 NOTE — Telephone Encounter (Signed)
-----   Message from Laqueta LindenSuresh A Koneswaran, MD sent at 05/05/2015 11:14 AM EDT ----- Increase pravastatin to 40 mg and repeat lipids in 3 months.

## 2015-05-05 NOTE — Telephone Encounter (Signed)
I spoke with patent, he states he has only been taking Pravastatin once a week because of leg cramps but he never told us this. I told him I would inform you and that we could switch his medication but he is adamant he wants to take it daily.

## 2016-01-27 DIAGNOSIS — E1142 Type 2 diabetes mellitus with diabetic polyneuropathy: Secondary | ICD-10-CM | POA: Diagnosis not present

## 2016-01-27 DIAGNOSIS — B351 Tinea unguium: Secondary | ICD-10-CM | POA: Diagnosis not present

## 2016-01-30 DIAGNOSIS — E118 Type 2 diabetes mellitus with unspecified complications: Secondary | ICD-10-CM | POA: Diagnosis not present

## 2016-01-30 DIAGNOSIS — E785 Hyperlipidemia, unspecified: Secondary | ICD-10-CM | POA: Diagnosis not present

## 2016-01-30 DIAGNOSIS — I10 Essential (primary) hypertension: Secondary | ICD-10-CM | POA: Diagnosis not present

## 2016-04-06 DIAGNOSIS — E1142 Type 2 diabetes mellitus with diabetic polyneuropathy: Secondary | ICD-10-CM | POA: Diagnosis not present

## 2016-04-06 DIAGNOSIS — B351 Tinea unguium: Secondary | ICD-10-CM | POA: Diagnosis not present

## 2016-06-04 DIAGNOSIS — I10 Essential (primary) hypertension: Secondary | ICD-10-CM | POA: Diagnosis not present

## 2016-06-04 DIAGNOSIS — Z6832 Body mass index (BMI) 32.0-32.9, adult: Secondary | ICD-10-CM | POA: Diagnosis not present

## 2016-06-04 DIAGNOSIS — E118 Type 2 diabetes mellitus with unspecified complications: Secondary | ICD-10-CM | POA: Diagnosis not present

## 2016-06-22 DIAGNOSIS — B351 Tinea unguium: Secondary | ICD-10-CM | POA: Diagnosis not present

## 2016-06-22 DIAGNOSIS — E1142 Type 2 diabetes mellitus with diabetic polyneuropathy: Secondary | ICD-10-CM | POA: Diagnosis not present

## 2016-08-31 DIAGNOSIS — B351 Tinea unguium: Secondary | ICD-10-CM | POA: Diagnosis not present

## 2016-08-31 DIAGNOSIS — E1142 Type 2 diabetes mellitus with diabetic polyneuropathy: Secondary | ICD-10-CM | POA: Diagnosis not present

## 2016-09-03 DIAGNOSIS — Z23 Encounter for immunization: Secondary | ICD-10-CM | POA: Diagnosis not present

## 2016-10-08 DIAGNOSIS — I10 Essential (primary) hypertension: Secondary | ICD-10-CM | POA: Diagnosis not present

## 2016-10-08 DIAGNOSIS — D649 Anemia, unspecified: Secondary | ICD-10-CM | POA: Diagnosis not present

## 2016-10-08 DIAGNOSIS — E118 Type 2 diabetes mellitus with unspecified complications: Secondary | ICD-10-CM | POA: Diagnosis not present

## 2016-11-16 DIAGNOSIS — E1142 Type 2 diabetes mellitus with diabetic polyneuropathy: Secondary | ICD-10-CM | POA: Diagnosis not present

## 2016-11-16 DIAGNOSIS — B351 Tinea unguium: Secondary | ICD-10-CM | POA: Diagnosis not present

## 2017-01-25 DIAGNOSIS — B351 Tinea unguium: Secondary | ICD-10-CM | POA: Diagnosis not present

## 2017-01-25 DIAGNOSIS — E1142 Type 2 diabetes mellitus with diabetic polyneuropathy: Secondary | ICD-10-CM | POA: Diagnosis not present

## 2017-02-11 DIAGNOSIS — E118 Type 2 diabetes mellitus with unspecified complications: Secondary | ICD-10-CM | POA: Diagnosis not present

## 2017-02-11 DIAGNOSIS — D649 Anemia, unspecified: Secondary | ICD-10-CM | POA: Diagnosis not present

## 2017-02-11 DIAGNOSIS — I10 Essential (primary) hypertension: Secondary | ICD-10-CM | POA: Diagnosis not present

## 2017-04-05 DIAGNOSIS — B351 Tinea unguium: Secondary | ICD-10-CM | POA: Diagnosis not present

## 2017-04-05 DIAGNOSIS — E1142 Type 2 diabetes mellitus with diabetic polyneuropathy: Secondary | ICD-10-CM | POA: Diagnosis not present

## 2017-05-15 DIAGNOSIS — I10 Essential (primary) hypertension: Secondary | ICD-10-CM | POA: Diagnosis not present

## 2017-05-15 DIAGNOSIS — E118 Type 2 diabetes mellitus with unspecified complications: Secondary | ICD-10-CM | POA: Diagnosis not present

## 2017-06-14 DIAGNOSIS — E1142 Type 2 diabetes mellitus with diabetic polyneuropathy: Secondary | ICD-10-CM | POA: Diagnosis not present

## 2017-06-14 DIAGNOSIS — B351 Tinea unguium: Secondary | ICD-10-CM | POA: Diagnosis not present

## 2017-08-30 DIAGNOSIS — B351 Tinea unguium: Secondary | ICD-10-CM | POA: Diagnosis not present

## 2017-08-30 DIAGNOSIS — E1142 Type 2 diabetes mellitus with diabetic polyneuropathy: Secondary | ICD-10-CM | POA: Diagnosis not present

## 2017-09-19 DIAGNOSIS — R69 Illness, unspecified: Secondary | ICD-10-CM | POA: Diagnosis not present

## 2017-10-07 DIAGNOSIS — N183 Chronic kidney disease, stage 3 (moderate): Secondary | ICD-10-CM | POA: Diagnosis not present

## 2017-10-07 DIAGNOSIS — E1122 Type 2 diabetes mellitus with diabetic chronic kidney disease: Secondary | ICD-10-CM | POA: Diagnosis not present

## 2017-10-07 DIAGNOSIS — D649 Anemia, unspecified: Secondary | ICD-10-CM | POA: Diagnosis not present

## 2017-10-07 DIAGNOSIS — I10 Essential (primary) hypertension: Secondary | ICD-10-CM | POA: Diagnosis not present

## 2017-12-31 DIAGNOSIS — B351 Tinea unguium: Secondary | ICD-10-CM | POA: Diagnosis not present

## 2017-12-31 DIAGNOSIS — E1142 Type 2 diabetes mellitus with diabetic polyneuropathy: Secondary | ICD-10-CM | POA: Diagnosis not present

## 2018-01-24 ENCOUNTER — Emergency Department (HOSPITAL_COMMUNITY)
Admission: EM | Admit: 2018-01-24 | Discharge: 2018-01-24 | Disposition: A | Discharge status: Home / Self Care | Payer: Medicare HMO | Attending: Emergency Medicine | Admitting: Emergency Medicine

## 2018-01-24 ENCOUNTER — Encounter (HOSPITAL_COMMUNITY): Payer: Self-pay

## 2018-01-24 DIAGNOSIS — R040 Epistaxis: Secondary | ICD-10-CM | POA: Insufficient documentation

## 2018-01-24 DIAGNOSIS — I10 Essential (primary) hypertension: Secondary | ICD-10-CM | POA: Insufficient documentation

## 2018-01-24 DIAGNOSIS — E119 Type 2 diabetes mellitus without complications: Secondary | ICD-10-CM | POA: Diagnosis not present

## 2018-01-24 DIAGNOSIS — Z79899 Other long term (current) drug therapy: Secondary | ICD-10-CM | POA: Diagnosis not present

## 2018-01-24 DIAGNOSIS — Z87891 Personal history of nicotine dependence: Secondary | ICD-10-CM | POA: Diagnosis not present

## 2018-01-24 DIAGNOSIS — Z7982 Long term (current) use of aspirin: Secondary | ICD-10-CM | POA: Diagnosis not present

## 2018-01-24 NOTE — ED Triage Notes (Signed)
Pt reports intermittent bleeding from right nare x 2 days. No bleeding at this time.

## 2018-01-24 NOTE — Discharge Instructions (Signed)
Return if you are not able to control the nosebleed at home.

## 2018-01-24 NOTE — ED Provider Notes (Signed)
Daniel Reynolds   [facility] EMERGENCY DEPARTMENT Provider Note   CSN: [id number] Arrival date & time: 01/24/18  0059     History   Chief Complaint Chief Complaint  Patient presents with  . Epistaxis    HPI Daniel Reynolds is a 84 y.o. male.  The history is provided by the patient.  He has history of hypertension, hyperlipidemia, diabetes and atherosclerotic cardiovascular disease with non-STEMI in 2006 who comes in with intermittent nosebleeds over the last several days.  Nosebleeds are right-sided and started after he blew his nose very hard.  It has been intermittent and not very brisk.  He does take low-dose aspirin, but is not on any systemic anticoagulants.  He has not had problems with nosebleeds before.  Past Medical History:  Diagnosis Date  . Acute renal injury ([place])    Transient; creatinine elevation;, returned to 1.0 in 07/2008  . Arteriosclerotic cardiovascular disease (ASCVD)    Non Q myocardial infarction in 02/2005; 80% small first diagnoal stenosis, 60% circumflex; normal EF  . Diabetes mellitus, type 2 (HCC)    no insulin, A1c level of 5.7 in 2009  . Erectile dysfunction   . Hyperlipidemia    Mild myositis secondary to atorvastatin  . Hypertension     Patient Active Problem List   Diagnosis Date Noted  . Pigmented skin lesion 03/13/2012  . Hyperlipidemia   . Hypertension   . Diabetes mellitus, type 2 ([place])   . ATHEROSCLEROTIC CARDIOVASCULAR DISEASE 10/20/2009  . ERECTILE DYSFUNCTION, ORGANIC 10/19/2009    Past Surgical History:  Procedure Laterality Date  . COLONOSCOPY  2008  . LUMBAR LAMINECTOMY  1982  . ROTATOR CUFF REPAIR  2005   Right       Home Medications    Prior to Admission medications   Medication Sig Start Date End Date Taking? Authorizing Provider  acetaminophen (TYLENOL) 500 MG tablet Take 1,000 mg by mouth every 6 (six) hours as needed for mild pain.    [provider]  aspirin 81 MG tablet Take 81 mg by mouth daily.       [provider]  ezetimibe (ZETIA) 10 MG tablet Take 1 tablet (10 mg total) by mouth daily. 12/14/11   Lendon Colonel, NP  glyBURIDE-metformin (GLUCOVANCE) 1.25-250 MG per tablet Take 1 tablet by mouth 2 (two) times daily.      [provider]  ibuprofen (ADVIL,MOTRIN) 800 MG tablet Take 800 mg by mouth every 8 (eight) hours as needed for moderate pain.     [provider]  metoprolol succinate (TOPROL-XL) 25 MG 24 hr tablet Take 25 mg by mouth daily. 04/17/15   [provider]  olmesartan-hydrochlorothiazide (BENICAR HCT) 40-25 MG per tablet Take 1 tablet by mouth daily.      [provider]  pioglitazone-metformin (ACTOPLUS MET) 15-850 MG per tablet Take 1 tablet by mouth daily.      [provider]  pravastatin (PRAVACHOL) 20 MG tablet Take 1 tablet (20 mg total) by mouth daily. 07/07/12   Yehuda Savannah, MD    Family History No family history on file.  Social History Social History   Tobacco Use  . Smoking status: Former Smoker    Packs/day: 0.50    Years: 8.00    Pack years: 4.00    Types: Cigarettes    Last attempt to quit: 12/10/1973    Years since quitting: 44.1  . Smokeless tobacco: Never Used  Substance Use Topics  . Alcohol use: No  .  Drug use: No     Allergies   Penicillins   Review of Systems Review of Systems  All other systems reviewed and are negative.    Physical Exam Updated Vital Signs BP (!) 157/82 (BP Location: Right Arm)   Pulse 70   Temp 97.6 F (36.4 C) (Oral)   Resp 16   SpO2 97%   Physical Exam  Nursing note and vitals reviewed.  85 year old male, resting comfortably and in no acute distress. Vital signs are significant for elevated systolic blood pressure. Oxygen saturation is 97%, which is normal. Head is normocephalic and atraumatic. PERRLA, EOMI. Oropharynx is clear.  Dried blood seen in the right nostril but no bleeding site seen on the right side of the nasal septum. Neck is  nontender and supple without adenopathy or JVD. Back is nontender and there is no CVA tenderness. Lungs are clear without rales, wheezes, or rhonchi. Chest is nontender. Heart has regular rate and rhythm without murmur. Abdomen is soft, flat, nontender without masses or hepatosplenomegaly and peristalsis is normoactive. Extremities have no cyanosis or edema, full range of motion is present. Skin is warm and dry without rash. Neurologic: Mental status is normal, cranial nerves are intact, there are no motor or sensory deficits.  ED Treatments / Results   Procedures Procedures (including critical care time)  Medications Ordered in ED Medications - No data to display   Initial Impression / Assessment and Plan / ED Course  I have reviewed the triage vital signs and the nursing notes.  Epistaxis which is probably related to aspirin use.  Old records are reviewed confirming non-STEMI in 2006 with catheterization which showed 80% stenosis in a small diagonal branch of the LAD 60-70% stenosis in the mid circumflex and 60-70% proximal obtuse marginal 3 stenosis.  Given his history of significant coronary atherosclerosis, I do not feel he should discontinue aspirin.  Management of nosebleeds was discussed and he will be referred to ENT for further evaluation.  No active bleeding today, so no indication for acute epistaxis management.  Final Clinical Impressions(s) / ED Diagnoses   Final diagnoses:  Right-sided epistaxis    ED Discharge Orders    None       Glick, David, MD 01/24/18 0152  

## 2018-02-10 DIAGNOSIS — E118 Type 2 diabetes mellitus with unspecified complications: Secondary | ICD-10-CM | POA: Diagnosis not present

## 2018-02-10 DIAGNOSIS — R040 Epistaxis: Secondary | ICD-10-CM | POA: Diagnosis not present

## 2018-02-10 DIAGNOSIS — I10 Essential (primary) hypertension: Secondary | ICD-10-CM | POA: Diagnosis not present

## 2018-02-10 DIAGNOSIS — N183 Chronic kidney disease, stage 3 (moderate): Secondary | ICD-10-CM | POA: Diagnosis not present

## 2018-03-11 DIAGNOSIS — B351 Tinea unguium: Secondary | ICD-10-CM | POA: Diagnosis not present

## 2018-03-11 DIAGNOSIS — E1142 Type 2 diabetes mellitus with diabetic polyneuropathy: Secondary | ICD-10-CM | POA: Diagnosis not present

## 2018-05-14 DIAGNOSIS — I10 Essential (primary) hypertension: Secondary | ICD-10-CM | POA: Diagnosis not present

## 2018-05-14 DIAGNOSIS — E118 Type 2 diabetes mellitus with unspecified complications: Secondary | ICD-10-CM | POA: Diagnosis not present

## 2018-05-14 DIAGNOSIS — E1122 Type 2 diabetes mellitus with diabetic chronic kidney disease: Secondary | ICD-10-CM | POA: Diagnosis not present

## 2018-05-14 DIAGNOSIS — N183 Chronic kidney disease, stage 3 (moderate): Secondary | ICD-10-CM | POA: Diagnosis not present

## 2018-05-20 DIAGNOSIS — E1142 Type 2 diabetes mellitus with diabetic polyneuropathy: Secondary | ICD-10-CM | POA: Diagnosis not present

## 2018-05-20 DIAGNOSIS — B351 Tinea unguium: Secondary | ICD-10-CM | POA: Diagnosis not present

## 2018-07-29 DIAGNOSIS — B351 Tinea unguium: Secondary | ICD-10-CM | POA: Diagnosis not present

## 2018-07-29 DIAGNOSIS — E1142 Type 2 diabetes mellitus with diabetic polyneuropathy: Secondary | ICD-10-CM | POA: Diagnosis not present

## 2018-08-17 DIAGNOSIS — R69 Illness, unspecified: Secondary | ICD-10-CM | POA: Diagnosis not present

## 2018-08-18 DIAGNOSIS — N183 Chronic kidney disease, stage 3 (moderate): Secondary | ICD-10-CM | POA: Diagnosis not present

## 2018-08-18 DIAGNOSIS — E1122 Type 2 diabetes mellitus with diabetic chronic kidney disease: Secondary | ICD-10-CM | POA: Diagnosis not present

## 2018-08-18 DIAGNOSIS — I10 Essential (primary) hypertension: Secondary | ICD-10-CM | POA: Diagnosis not present

## 2018-08-18 DIAGNOSIS — E785 Hyperlipidemia, unspecified: Secondary | ICD-10-CM | POA: Diagnosis not present

## 2018-08-18 DIAGNOSIS — Z0000 Encounter for general adult medical examination without abnormal findings: Secondary | ICD-10-CM | POA: Diagnosis not present

## 2018-08-18 DIAGNOSIS — N390 Urinary tract infection, site not specified: Secondary | ICD-10-CM | POA: Diagnosis not present

## 2018-09-12 DIAGNOSIS — R69 Illness, unspecified: Secondary | ICD-10-CM | POA: Diagnosis not present

## 2018-10-07 DIAGNOSIS — B351 Tinea unguium: Secondary | ICD-10-CM | POA: Diagnosis not present

## 2018-10-07 DIAGNOSIS — E1142 Type 2 diabetes mellitus with diabetic polyneuropathy: Secondary | ICD-10-CM | POA: Diagnosis not present

## 2018-11-04 ENCOUNTER — Emergency Department (HOSPITAL_COMMUNITY): Payer: Medicare HMO

## 2018-11-04 ENCOUNTER — Other Ambulatory Visit: Payer: Self-pay

## 2018-11-04 ENCOUNTER — Encounter (HOSPITAL_COMMUNITY): Payer: Self-pay | Admitting: Emergency Medicine

## 2018-11-04 ENCOUNTER — Emergency Department (HOSPITAL_COMMUNITY)
Admission: EM | Admit: 2018-11-04 | Discharge: 2018-11-04 | Disposition: A | Discharge status: Home / Self Care | Payer: Medicare HMO | Attending: Emergency Medicine | Admitting: Emergency Medicine

## 2018-11-04 DIAGNOSIS — S0990XA Unspecified injury of head, initial encounter: Secondary | ICD-10-CM | POA: Diagnosis not present

## 2018-11-04 DIAGNOSIS — Z79899 Other long term (current) drug therapy: Secondary | ICD-10-CM | POA: Insufficient documentation

## 2018-11-04 DIAGNOSIS — Y9301 Activity, walking, marching and hiking: Secondary | ICD-10-CM | POA: Insufficient documentation

## 2018-11-04 DIAGNOSIS — Z7984 Long term (current) use of oral hypoglycemic drugs: Secondary | ICD-10-CM | POA: Insufficient documentation

## 2018-11-04 DIAGNOSIS — S199XXA Unspecified injury of neck, initial encounter: Secondary | ICD-10-CM | POA: Diagnosis not present

## 2018-11-04 DIAGNOSIS — Y999 Unspecified external cause status: Secondary | ICD-10-CM | POA: Diagnosis not present

## 2018-11-04 DIAGNOSIS — I10 Essential (primary) hypertension: Secondary | ICD-10-CM | POA: Diagnosis not present

## 2018-11-04 DIAGNOSIS — S161XXA Strain of muscle, fascia and tendon at neck level, initial encounter: Secondary | ICD-10-CM | POA: Diagnosis not present

## 2018-11-04 DIAGNOSIS — W010XXA Fall on same level from slipping, tripping and stumbling without subsequent striking against object, initial encounter: Secondary | ICD-10-CM | POA: Insufficient documentation

## 2018-11-04 DIAGNOSIS — I2510 Atherosclerotic heart disease of native coronary artery without angina pectoris: Secondary | ICD-10-CM | POA: Diagnosis not present

## 2018-11-04 DIAGNOSIS — Y92488 Other paved roadways as the place of occurrence of the external cause: Secondary | ICD-10-CM | POA: Insufficient documentation

## 2018-11-04 DIAGNOSIS — E119 Type 2 diabetes mellitus without complications: Secondary | ICD-10-CM | POA: Insufficient documentation

## 2018-11-04 DIAGNOSIS — S0003XA Contusion of scalp, initial encounter: Secondary | ICD-10-CM | POA: Diagnosis not present

## 2018-11-04 DIAGNOSIS — Z7982 Long term (current) use of aspirin: Secondary | ICD-10-CM | POA: Diagnosis not present

## 2018-11-04 DIAGNOSIS — W19XXXA Unspecified fall, initial encounter: Secondary | ICD-10-CM

## 2018-11-04 LAB — CBG MONITORING, ED: Glucose-Capillary: 102 mg/dL — ABNORMAL HIGH (ref 70–99)

## 2018-11-04 NOTE — ED Triage Notes (Signed)
Patient states he was walking this morning and fell hitting the back of his head. Denies LOC. States he was bending over to pick up something and fell. States he has had dizziness intermittently since Sunday.

## 2018-11-04 NOTE — Discharge Instructions (Signed)
Your blood pressure is slightly elevated at 174/78.  Please have this rechecked soon.  The CT scan of your head is negative for any bleeding, swelling, skull fracture, or other emergent situation.  The CT scan of your neck shows multiple areas of arthritis and degenerative disc disease, but no fracture, no dislocation, and no emergent changes as a result of the fall.  Your examination is negative for acute findings at this time.  Please use Tylenol extra strength for soreness.  Please see Dr. Loleta ChanceHill or return to the emergency department if any changes in your condition, problems, or concerns.

## 2018-11-04 NOTE — ED Provider Notes (Signed)
[facility] EMERGENCY DEPARTMENT Provider Note   CSN: [id number] Arrival date & time: 11/04/18  5035     History   Chief Complaint Chief Complaint  Patient presents with  . Fall    HPI Daniel Reynolds is a 84 y.o. male.  Patient is an 84 year old male who presents to the emergency department following a fall.  The patient states that he was doing his morning walking.  He stopped to pick something up, lost his balance and fell hitting the back of his head on concrete.  The patient states he had some mild dizziness, but he says he has had some dizziness for the last 2 days.  Patient denies being on any anticoagulation medications.  There is no chest pain during the fall, no palpitations appreciated.  The patient states that he has been diagnosed with diabetes, but he is not having any problem as long as he is doing his exercising.  He presents now for evaluation following this fall.  The history is provided by the patient.    Past Medical History:  Diagnosis Date  . Acute renal injury ([place])    Transient; creatinine elevation;, returned to 1.0 in 07/2008  . Arteriosclerotic cardiovascular disease (ASCVD)    Non Q myocardial infarction in 02/2005; 80% small first diagnoal stenosis, 60% circumflex; normal EF  . Diabetes mellitus, type 2 (HCC)    no insulin, A1c level of 5.7 in 2009  . Erectile dysfunction   . Hyperlipidemia    Mild myositis secondary to atorvastatin  . Hypertension     Patient Active Problem List   Diagnosis Date Noted  . Pigmented skin lesion 03/13/2012  . Hyperlipidemia   . Hypertension   . Diabetes mellitus, type 2 ([place])   . ATHEROSCLEROTIC CARDIOVASCULAR DISEASE 10/20/2009  . ERECTILE DYSFUNCTION, ORGANIC 10/19/2009    Past Surgical History:  Procedure Laterality Date  . COLONOSCOPY  2008  . LUMBAR LAMINECTOMY  1982  . ROTATOR CUFF REPAIR  2005   Right        Home Medications    Prior to Admission medications   Medication Sig Start Date  End Date Taking? Authorizing Provider  acetaminophen (TYLENOL) 500 MG tablet Take 1,000 mg by mouth every 6 (six) hours as needed for mild pain.    [provider]  aspirin 81 MG tablet Take 81 mg by mouth daily.      [provider]  ezetimibe (ZETIA) 10 MG tablet Take 1 tablet (10 mg total) by mouth daily. 12/14/11   Lendon Colonel, NP  glipiZIDE-metformin (METAGLIP) 2.5-500 MG tablet Take 0.5 tablets by mouth 2 (two) times daily. 10/06/18   [provider]  glyBURIDE-metformin (GLUCOVANCE) 1.25-250 MG per tablet Take 1 tablet by mouth 2 (two) times daily.      [provider]  ibuprofen (ADVIL,MOTRIN) 800 MG tablet Take 800 mg by mouth every 8 (eight) hours as needed for moderate pain.     [provider]  metoprolol succinate (TOPROL-XL) 25 MG 24 hr tablet Take 25 mg by mouth daily. 04/17/15   [provider]  olmesartan-hydrochlorothiazide (BENICAR HCT) 40-25 MG per tablet Take 1 tablet by mouth daily.      [provider]  pioglitazone-metformin (ACTOPLUS MET) 15-850 MG per tablet Take 1 tablet by mouth daily.      [provider]  pravastatin (PRAVACHOL) 20 MG tablet Take 1 tablet (20 mg total) by mouth daily. 07/07/12   Yehuda Savannah, MD  Family History History reviewed. No pertinent family history.  Social History Social History   Tobacco Use  . Smoking status: Former Smoker    Packs/day: 0.50    Years: 8.00    Pack years: 4.00    Types: Cigarettes    Last attempt to quit: 12/10/1973    Years since quitting: 44.9  . Smokeless tobacco: Never Used  Substance Use Topics  . Alcohol use: No  . Drug use: No     Allergies   Penicillins   Review of Systems Review of Systems  Constitutional: Negative for activity change.       All ROS Neg except as noted in HPI  HENT: Negative for nosebleeds.   Eyes: Negative for photophobia and discharge.  Respiratory: Negative for cough, shortness of breath and  wheezing.   Cardiovascular: Negative for chest pain and palpitations.  Gastrointestinal: Negative for abdominal pain and blood in stool.  Genitourinary: Negative for dysuria, frequency and hematuria.  Musculoskeletal: Positive for arthralgias. Negative for back pain and neck pain.  Skin: Negative.   Neurological: Positive for dizziness. Negative for seizures and speech difficulty.  Psychiatric/Behavioral: Negative for confusion and hallucinations.     Physical Exam Updated Vital Signs BP (!) 174/78 (BP Location: Left Arm)   Pulse (!) 58   Temp 98.2 F (36.8 C) (Oral)   Resp 20   Ht '5\' 10"'$  (1.778 m)   Wt 88.5 kg   SpO2 100%   BMI 27.98 kg/m   Physical Exam  Constitutional: He appears well-developed and well-nourished. No distress.  HENT:  Head: Head is with contusion. Head is without raccoon's eyes, without Battle's sign, without laceration, without right periorbital erythema and without left periorbital erythema.    Right Ear: External ear normal.  Left Ear: External ear normal.  Negative battle sign.  No raccoon sign.  No injury to the temporomandibular joint area.  The oropharynx is clear.  The uvula is in the midline.  No injury to the teeth.  Eyes: Conjunctivae are normal. Right eye exhibits no discharge. Left eye exhibits no discharge. No scleral icterus.  Neck: Neck supple. No tracheal deviation present.  Cardiovascular: Normal rate, regular rhythm and intact distal pulses.  Pulmonary/Chest: Effort normal and breath sounds normal. No stridor. No respiratory distress. He has no wheezes. He has no rales.  Abdominal: Soft. Bowel sounds are normal. He exhibits no distension. There is no tenderness. There is no rebound and no guarding.  Musculoskeletal: He exhibits no edema or tenderness.  Pain with palpation and movement of the right shoulder.  Patient states he has had surgical procedures on that shoulder and has a lot of arthritis in it.  He states that this pain has not  changed, and is not related to his fall.  No deformity appreciated.  Neurological: He is alert. He has normal strength. No cranial nerve deficit (no facial droop, extraocular movements intact, no slurred speech) or sensory deficit. He exhibits normal muscle tone. He displays no seizure activity. Coordination normal.  Skin: Skin is warm and dry. No rash noted.  Psychiatric: He has a normal mood and affect.  Nursing note and vitals reviewed.    ED Treatments / Results  Labs (all labs ordered are listed, but only abnormal results are displayed) Labs Reviewed - No data to display  EKG None  Radiology No results found.  Procedures Procedures (including critical care time)  Medications Ordered in ED Medications - No data to display   Initial Impression / Assessment  and Plan / ED Course  I have reviewed the triage vital signs and the nursing notes.  Pertinent labs & imaging results that were available during my care of the patient were reviewed by me and considered in my medical decision making (see chart for details).     Patient seen with me by Dr. Lacinda Axon Final Clinical Impressions(s) / ED Diagnoses  MDM  Patient sustained a mechanical fall and hit the back of his head.  No gross neurologic deficit appreciated on examination.  CT scan of the head is negative for acute changes.  CT scan of the cervical spine reveals degenerative disc disease and signs of arthritis, but no fracture, no dislocation.  I have advised the patient and his wife of these findings.  I ambulated the patient in the room as well as in the hall.  It is safe for the patient to be discharged home.  Patient is to return to the emergency department if any changes in his condition, problems, or concerns.   Final diagnoses:  Contusion of scalp, initial encounter  Strain of neck muscle, initial encounter  Fall, initial encounter    ED Discharge Orders    None       Lily Kocher, PA-C 11/04/18 1656      Nat Christen, MD 11/05/18 [phone]

## 2018-11-24 DIAGNOSIS — Z6830 Body mass index (BMI) 30.0-30.9, adult: Secondary | ICD-10-CM | POA: Diagnosis not present

## 2018-11-24 DIAGNOSIS — R69 Illness, unspecified: Secondary | ICD-10-CM | POA: Diagnosis not present

## 2018-11-24 DIAGNOSIS — E118 Type 2 diabetes mellitus with unspecified complications: Secondary | ICD-10-CM | POA: Diagnosis not present

## 2018-11-24 DIAGNOSIS — I10 Essential (primary) hypertension: Secondary | ICD-10-CM | POA: Diagnosis not present

## 2018-12-23 DIAGNOSIS — E1142 Type 2 diabetes mellitus with diabetic polyneuropathy: Secondary | ICD-10-CM | POA: Diagnosis not present

## 2018-12-23 DIAGNOSIS — Z6830 Body mass index (BMI) 30.0-30.9, adult: Secondary | ICD-10-CM | POA: Diagnosis not present

## 2018-12-23 DIAGNOSIS — B351 Tinea unguium: Secondary | ICD-10-CM | POA: Diagnosis not present

## 2018-12-23 DIAGNOSIS — I10 Essential (primary) hypertension: Secondary | ICD-10-CM | POA: Diagnosis not present

## 2018-12-23 DIAGNOSIS — R69 Illness, unspecified: Secondary | ICD-10-CM | POA: Diagnosis not present

## 2018-12-23 DIAGNOSIS — S0003XA Contusion of scalp, initial encounter: Secondary | ICD-10-CM | POA: Diagnosis not present

## 2018-12-23 DIAGNOSIS — E118 Type 2 diabetes mellitus with unspecified complications: Secondary | ICD-10-CM | POA: Diagnosis not present

## 2019-01-27 DIAGNOSIS — E1122 Type 2 diabetes mellitus with diabetic chronic kidney disease: Secondary | ICD-10-CM | POA: Diagnosis not present

## 2019-01-27 DIAGNOSIS — I10 Essential (primary) hypertension: Secondary | ICD-10-CM | POA: Diagnosis not present

## 2019-03-03 DIAGNOSIS — B351 Tinea unguium: Secondary | ICD-10-CM | POA: Diagnosis not present

## 2019-03-03 DIAGNOSIS — E1142 Type 2 diabetes mellitus with diabetic polyneuropathy: Secondary | ICD-10-CM | POA: Diagnosis not present

## 2019-04-27 DIAGNOSIS — Z7189 Other specified counseling: Secondary | ICD-10-CM | POA: Diagnosis not present

## 2019-04-27 DIAGNOSIS — E1122 Type 2 diabetes mellitus with diabetic chronic kidney disease: Secondary | ICD-10-CM | POA: Diagnosis not present

## 2019-04-27 DIAGNOSIS — N183 Chronic kidney disease, stage 3 (moderate): Secondary | ICD-10-CM | POA: Diagnosis not present

## 2019-04-27 DIAGNOSIS — I10 Essential (primary) hypertension: Secondary | ICD-10-CM | POA: Diagnosis not present

## 2019-05-12 DIAGNOSIS — B351 Tinea unguium: Secondary | ICD-10-CM | POA: Diagnosis not present

## 2019-05-12 DIAGNOSIS — E1142 Type 2 diabetes mellitus with diabetic polyneuropathy: Secondary | ICD-10-CM | POA: Diagnosis not present

## 2019-07-24 DIAGNOSIS — E1142 Type 2 diabetes mellitus with diabetic polyneuropathy: Secondary | ICD-10-CM | POA: Diagnosis not present

## 2019-07-24 DIAGNOSIS — B351 Tinea unguium: Secondary | ICD-10-CM | POA: Diagnosis not present

## 2019-08-11 DIAGNOSIS — N390 Urinary tract infection, site not specified: Secondary | ICD-10-CM | POA: Diagnosis not present

## 2019-08-11 DIAGNOSIS — E1169 Type 2 diabetes mellitus with other specified complication: Secondary | ICD-10-CM | POA: Diagnosis not present

## 2019-08-11 DIAGNOSIS — Z0000 Encounter for general adult medical examination without abnormal findings: Secondary | ICD-10-CM | POA: Diagnosis not present

## 2019-08-11 DIAGNOSIS — I10 Essential (primary) hypertension: Secondary | ICD-10-CM | POA: Diagnosis not present

## 2019-08-11 DIAGNOSIS — M545 Low back pain: Secondary | ICD-10-CM | POA: Diagnosis not present

## 2019-08-16 DIAGNOSIS — R69 Illness, unspecified: Secondary | ICD-10-CM | POA: Diagnosis not present

## 2019-08-28 DIAGNOSIS — R69 Illness, unspecified: Secondary | ICD-10-CM | POA: Diagnosis not present

## 2019-10-02 DIAGNOSIS — B351 Tinea unguium: Secondary | ICD-10-CM | POA: Diagnosis not present

## 2019-10-02 DIAGNOSIS — E1142 Type 2 diabetes mellitus with diabetic polyneuropathy: Secondary | ICD-10-CM | POA: Diagnosis not present

## 2019-11-10 DIAGNOSIS — E1169 Type 2 diabetes mellitus with other specified complication: Secondary | ICD-10-CM | POA: Diagnosis not present

## 2019-11-10 DIAGNOSIS — M25512 Pain in left shoulder: Secondary | ICD-10-CM | POA: Diagnosis not present

## 2019-11-10 DIAGNOSIS — Z7189 Other specified counseling: Secondary | ICD-10-CM | POA: Diagnosis not present

## 2019-11-10 DIAGNOSIS — Z6830 Body mass index (BMI) 30.0-30.9, adult: Secondary | ICD-10-CM | POA: Diagnosis not present

## 2020-01-22 IMAGING — CT CT CERVICAL SPINE W/O CM
4 of 7 series · 13 of 33 positions shown, 14 images · non-contrast
Comparison: None

CLINICAL DATA: Recent fall

EXAM:
CT HEAD WITHOUT CONTRAST
CT CERVICAL SPINE WITHOUT CONTRAST
TECHNIQUE: Multidetector CT imaging of the head and cervical spine was
performed following the standard protocol without intravenous
contrast. Multiplanar CT image reconstructions of the cervical spine
were also generated.

[Series 7: c spine soft · axial · 0.28mm/px · z∈[-136,-32]mm · 4 of 88 slices shown]
[im 18/88  soft-tissue]
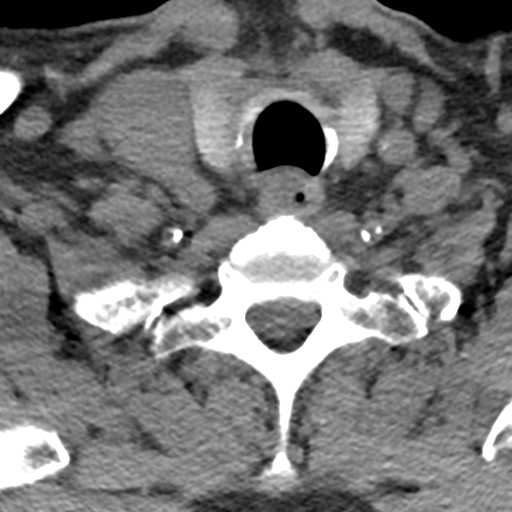
[im 35/88  soft-tissue]
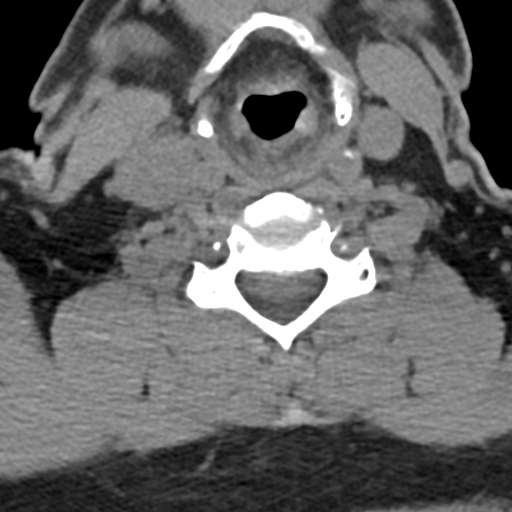
[im 53/88  soft-tissue]
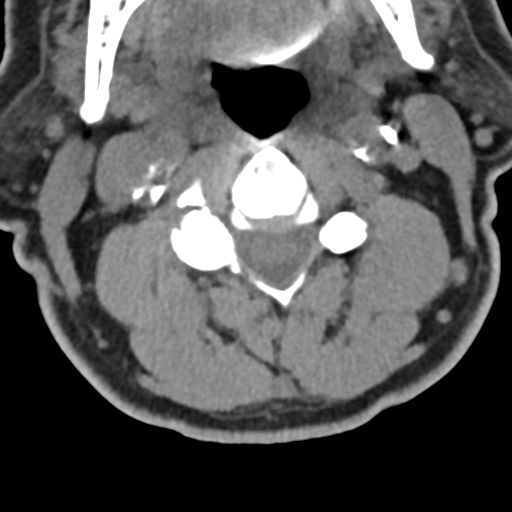
[im 70/88  soft-tissue]
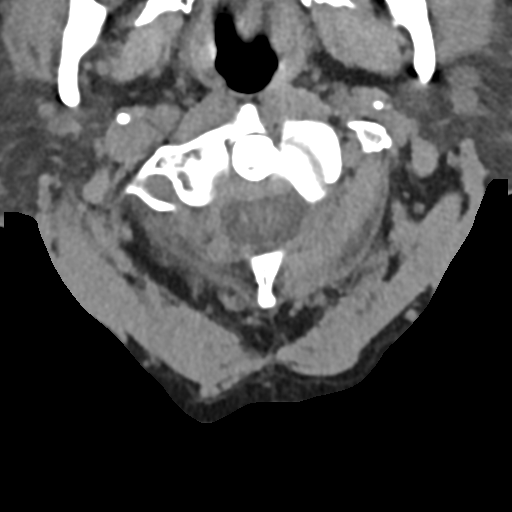

[Series 8: sagittal bone · sagittal · 0.32mm/px · 4 of 61 slices shown]
[im 13/61  bone]
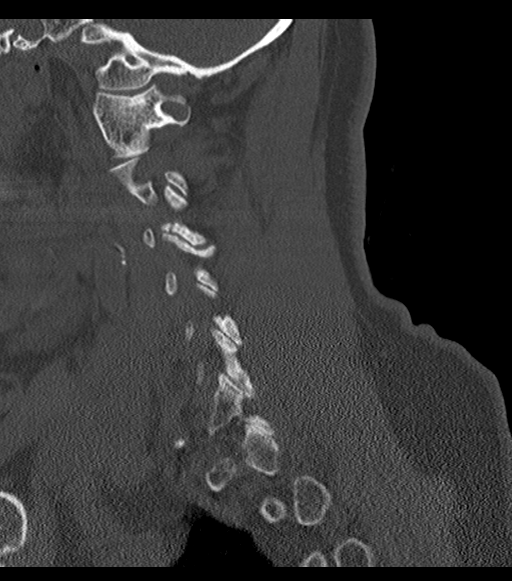
[im 25/61  bone]
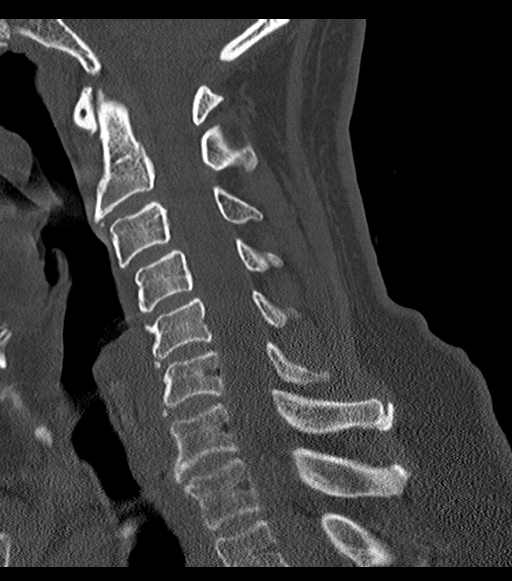
[im 37/61  bone]
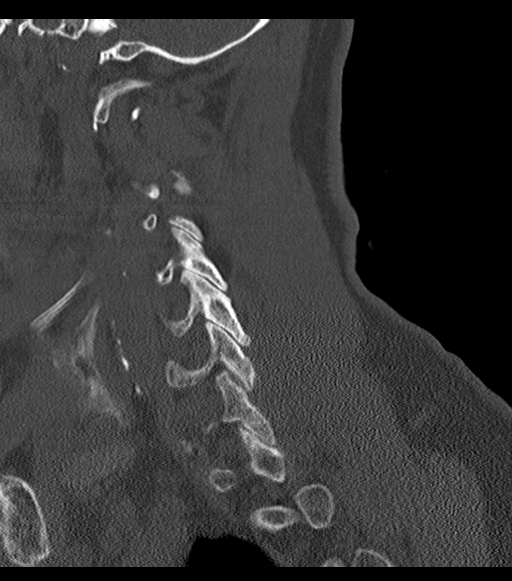
[im 49/61  bone]
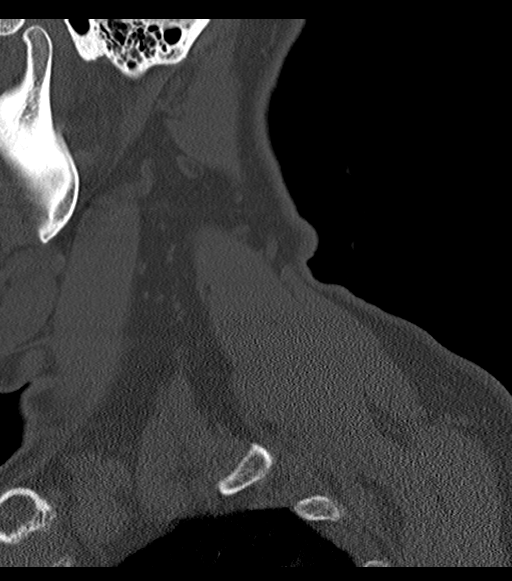

[Series 9: coronal bone · coronal · 0.26mm/px · 1 of 66 slices shown]
[im 33/66  bone]
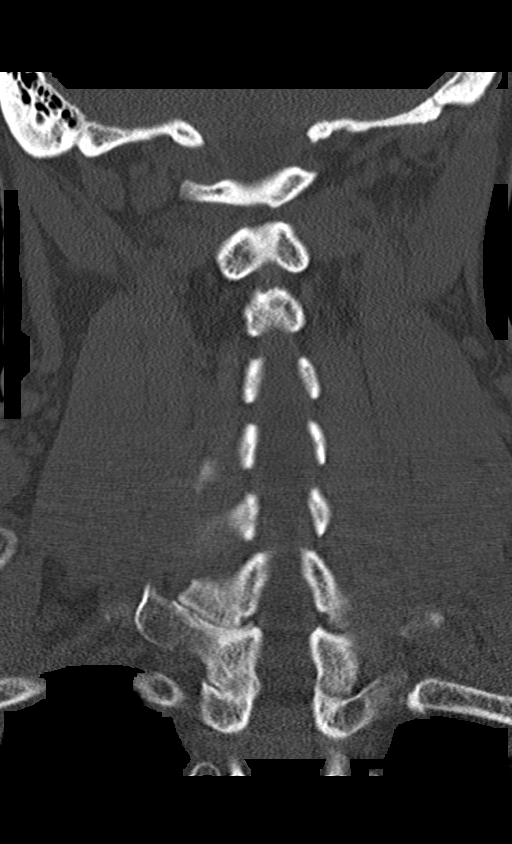

[Series 13: orthogonal bone · axial · 0.21mm/px · z∈[-151,-56]mm · 4 of 92 slices shown, 5 images]
[im 19/92  soft-tissue]
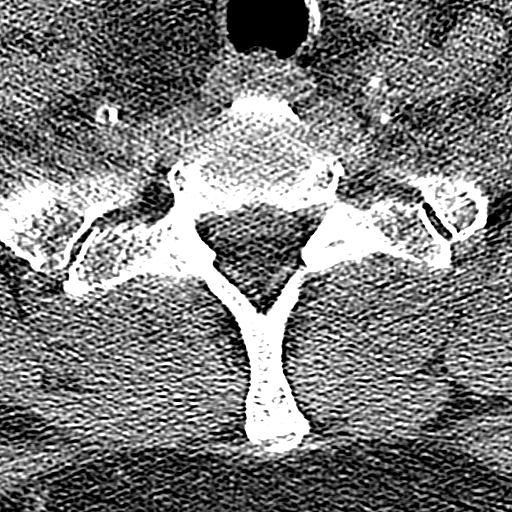
[im 19/92  bone]
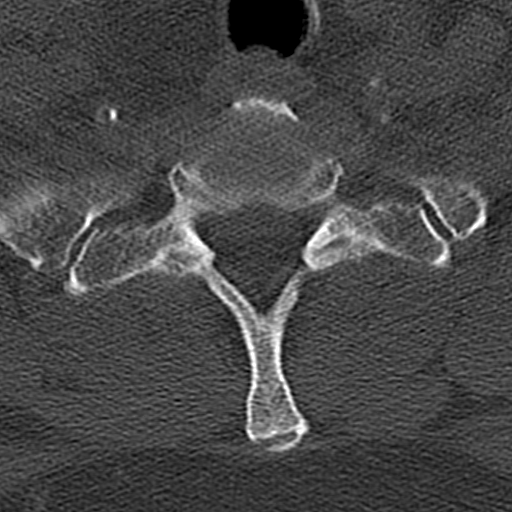
[im 37/92  bone]
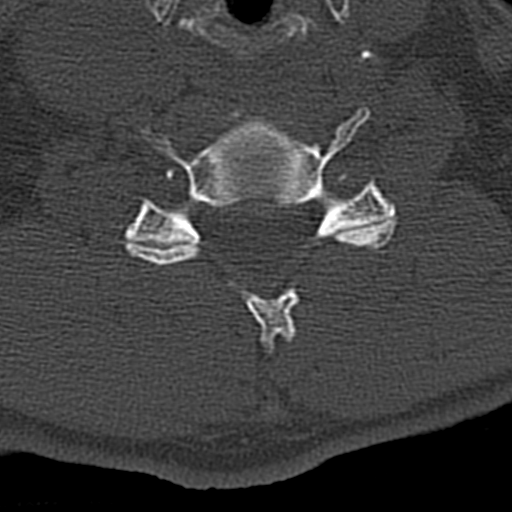
[im 55/92  bone]
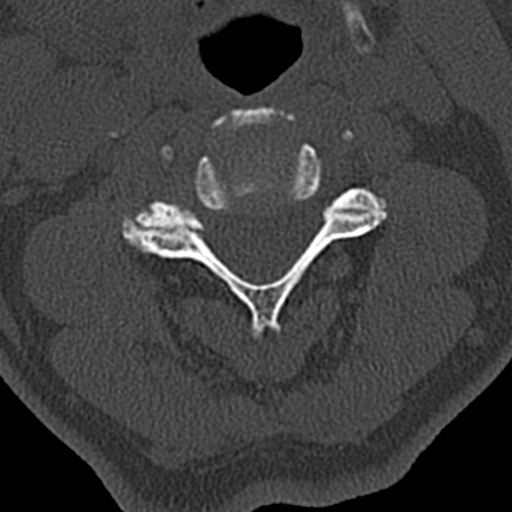
[im 73/92  bone]
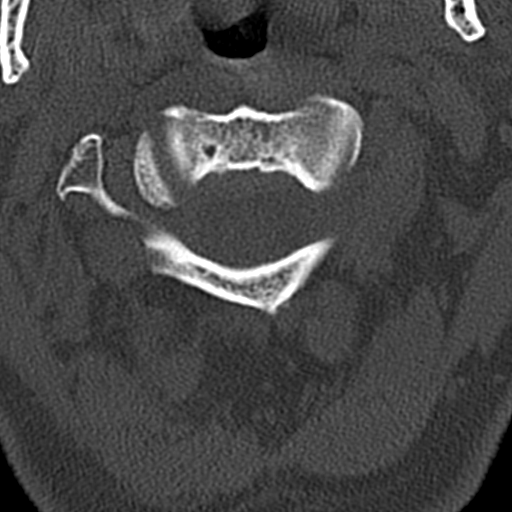

[13 of 33 positions shown; findings below may reference images not displayed]

FINDINGS: CT HEAD FINDINGS

Brain: Mild atrophic changes and chronic white matter ischemic
changes are seen. Small lacunar infarct is noted in the subinsular
cortex on the left. No findings to suggest acute hemorrhage, acute
infarction or space-occupying mass lesion are noted.

Vascular: No hyperdense vessel or unexpected calcification.

Skull: Normal. Negative for fracture or focal lesion.

Sinuses/Orbits: No acute finding.

Other: None.

CT CERVICAL SPINE FINDINGS

Alignment: Mild straightening of the normal cervical lordosis is
noted likely related to muscular spasm.

Skull base and vertebrae: 7 cervical segments are well visualized.
Multilevel osteophytic changes are seen. Facet hypertrophic changes
are noted at multiple levels. No acute fracture or acute facet
abnormality is seen. The odontoid is within normal limits.

Soft tissues and spinal canal: Surrounding soft tissues are within
normal limits.

Upper chest: Within normal limits.

Other: None
IMPRESSION: CT of the head: Chronic atrophic and ischemic changes without acute
abnormality.

CT of the cervical spine: Multilevel degenerative change without
acute abnormality.

## 2022-08-09 ENCOUNTER — Ambulatory Visit (HOSPITAL_COMMUNITY)
Admission: RE | Admit: 2022-08-09 | Discharge: 2022-08-09 | Disposition: A | Discharge status: Home / Self Care | Payer: Medicare HMO | Source: Ambulatory Visit | Attending: Family Medicine | Admitting: Family Medicine

## 2022-08-09 ENCOUNTER — Other Ambulatory Visit (HOSPITAL_COMMUNITY): Payer: Self-pay | Admitting: Family Medicine

## 2022-08-09 DIAGNOSIS — M25562 Pain in left knee: Secondary | ICD-10-CM
